# Patient Record
Sex: Female | Born: 1970 | Race: White | Hispanic: No | Marital: Married | Smoking: Never smoker
Health system: Southern US, Community
[De-identification: ages and names within clinical notes are randomized; demographics above are authoritative.]

## PROBLEM LIST (undated history)

## (undated) DIAGNOSIS — Z87442 Personal history of urinary calculi: Secondary | ICD-10-CM

## (undated) DIAGNOSIS — C801 Malignant (primary) neoplasm, unspecified: Secondary | ICD-10-CM

## (undated) DIAGNOSIS — R51 Headache: Secondary | ICD-10-CM

## (undated) DIAGNOSIS — I10 Essential (primary) hypertension: Secondary | ICD-10-CM

## (undated) HISTORY — PX: DILATION AND CURETTAGE OF UTERUS: SHX78

## (undated) HISTORY — PX: APPENDECTOMY: SHX54

---

## 2003-10-06 ENCOUNTER — Emergency Department (HOSPITAL_COMMUNITY): Admission: EM | Admit: 2003-10-06 | Discharge: 2003-10-06 | Payer: Self-pay | Admitting: Family Medicine

## 2003-11-26 ENCOUNTER — Encounter (INDEPENDENT_AMBULATORY_CARE_PROVIDER_SITE_OTHER): Payer: Self-pay | Admitting: *Deleted

## 2003-11-26 ENCOUNTER — Ambulatory Visit (HOSPITAL_COMMUNITY): Admission: AD | Admit: 2003-11-26 | Discharge: 2003-11-26 | Payer: Self-pay | Admitting: Obstetrics and Gynecology

## 2004-11-02 ENCOUNTER — Ambulatory Visit (HOSPITAL_COMMUNITY): Admission: RE | Admit: 2004-11-02 | Discharge: 2004-11-02 | Payer: Self-pay | Admitting: Obstetrics and Gynecology

## 2004-12-06 ENCOUNTER — Inpatient Hospital Stay (HOSPITAL_COMMUNITY): Admission: AD | Admit: 2004-12-06 | Discharge: 2004-12-08 | Payer: Self-pay | Admitting: Obstetrics and Gynecology

## 2006-09-22 ENCOUNTER — Inpatient Hospital Stay (HOSPITAL_COMMUNITY): Admission: RE | Admit: 2006-09-22 | Discharge: 2006-09-24 | Payer: Self-pay | Admitting: Obstetrics and Gynecology

## 2010-07-03 NOTE — H&P (Signed)
Kathryn Dean, WILLISON NO.:  [id number]   MEDICAL RECORD NO.:  [id number]          PATIENT TYPE:  INP   LOCATION:  9127                          FACILITY:  [facility]   PHYSICIAN:  Lenoard Aden, M.D.DATE OF BIRTH:  24-Apr-1970   DATE OF ADMISSION:  09/22/2006  DATE OF DISCHARGE:                              HISTORY & PHYSICAL   INDICATION:  For induction.   HISTORY:  The patient is a 40 year old white female, G4, P3, at [redacted] weeks  gestation for induction due to favorable cervix and history of  precipitous labor.  She has no known drug allergies.   MEDICATIONS:  Prenatal vitamins.   She has a history of an appendectomy, kidney stones, macrosomia, missed  AB x1, SAB x2, and three uncomplicated vaginal deliveries.   She is a nonsmoker, nondrinker. She denies domestic or physical  violence.   FAMILY HISTORY:  Epilepsy, myocardial infarction, lung cancer, rheumatic  joint disease.   PHYSICAL EXAMINATION:  GENERAL:  She is a well-developed, well-nourished  white female in no acute distress.  HEENT:  Normal.  LUNGS: Clear.  HEART:  Regular rate and rhythm.  ABDOMEN: Soft, gravid, nontender.  Estimated fetal weight 8 pounds.  PELVIC:  Cervix is 3, 70%, vertex, -1.  EXTREMITIES:  No cords.  NEUROLOGIC:  Exam nonfocal.   IMPRESSION:  1. A 39-week intrauterine pregnancy.  2. History of precipitous labor with favorable cervix for induction.   PLAN:  Proceed with induction. Risks, benefits discussed.      Lenoard Aden, M.D.  Electronically Signed     RJT/MEDQ  D:  09/22/2006  T:  09/22/2006  Job:  [id number]

## 2010-07-06 NOTE — H&P (Signed)
NAMEMADYSEN, Kathryn Dean NO.:  [id number]   MEDICAL RECORD NO.:  [id number]          PATIENT TYPE:  INP   LOCATION:                                [facility]   PHYSICIAN:  Lenoard Aden, M.D.DATE OF BIRTH:  1970/10/30   DATE OF ADMISSION:  12/06/2004  DATE OF DISCHARGE:  12/08/2004                                HISTORY & PHYSICAL   CHIEF COMPLAINT:  Post dates.   HISTORY OF PRESENT ILLNESS:  She is a 40 year old white female, gravida 6,  para 3-0-3-2 at 40+ weeks with a favorable cervix for post dates induction.   ALLERGIES:  PENICILLIN, CIPRO.   MEDICATIONS:  Prenatal vitamins.   PAST MEDICAL HISTORY:  Two vaginal deliveries of 9 pound 3 ounce female and  8 pound 5 ounce female.  Two SAB's with D&E and one complete SAB.   SOCIAL HISTORY:  She is a nonsmoker and nondrinker.  Denies domestic or  physical violence.  She has a history of urolithiasis and history of  appendectomy.   FAMILY HISTORY:  Kidney and lung cancer, myocardial infarction, and DVT.   PRENATAL LABORATORY DATA:  Blood type O positive, rubella immune, hepatitis  and HIV nonreactive, GC and Chlamydia negative, and GBS is negative.  Pregnancy complicated by post dates and otherwise presumptive urolithiasis  in the third trimester.   PHYSICAL EXAMINATION:  GENERAL:  She is a well-developed, well-nourished,  white female in no acute distress.  HEENT:  Normal.  LUNGS:  Clear.  HEART:  Regular rate and rhythm.  ABDOMEN:  Soft, gravid, and nontender.  Estimated fetal weight 7-1/2 pounds.  PELVIC:  Cervix 2-3 cm, vertex, thick, -2.  EXTREMITIES:  No cords.  NEUROLOGY:  Nonfocal.   IMPRESSION:  1.  40+ week intrauterine pregnancy.  2.  History of kidney stones.   PLAN:  Proceed with induction.  Risks, benefits, discussed.  Pitocin and  epidural as needed.      Lenoard Aden, M.D.  Electronically Signed     RJT/MEDQ  D:  12/05/2004  T:  12/05/2004  Job:  [id number]

## 2010-07-06 NOTE — Op Note (Signed)
NAMEUNDRA, TREMBATH NO.:  [id number]   MEDICAL RECORD NO.:  [id number]          PATIENT TYPE:  AMB   LOCATION:  SDC                           [facility]  [facility]   PHYSICIAN:  Lenoard Aden, M.D.DATE OF BIRTH:  September 07, 1970   DATE OF PROCEDURE:  11/26/2003  DATE OF DISCHARGE:                                 OPERATIVE REPORT   PREOPERATIVE DIAGNOSIS:  Missed abortion at seven weeks.   POSTOPERATIVE DIAGNOSIS:  Missed abortion at seven weeks.   PROCEDURE:  Suction dilation and evacuation.   SURGEON:  Lenoard Aden, M.D.   ANESTHESIA:  MAC and paracervical.   ESTIMATED BLOOD LOSS:  50 mL.   COMPLICATIONS:  None.   DRAINS:  None.   COUNTS:  Correct.   Patient to recovery in good condition.   BRIEF OPERATIVE NOTE:  After being apprised of the risks of anesthesia,  infection, bleeding, injury to abdominal organs with need for repair,  uterine perforation and possible need for repair, the patient was brought to  the operating room, where she was administered IV sedation without  difficulty, prepped and draped in the usual sterile fashion, catheterized  until the bladder empty.  Exam under anesthesia revealed an eight-week size  uterus, no adnexal masses.  A paracervical block then placed with 20 mL of a  dilute Xylocaine solution.  The cervix is grasped with a single-tooth  tenaculum and easily dilated up to a #23 Pratt dilator after sounding to 10  cm.  A 7 mm suction curette placed, suction is initiated, and products of  conception are noted.  Repeat suction curettage reveals the cavity to be  empty, good hemostasis is noted.  All instruments are removed.  The patient  is transferred to recovery in good condition.      RJT/MEDQ  D:  11/26/2003  T:  11/26/2003  Job:  [id number]

## 2010-12-03 LAB — CBC
HCT: 27.6 — ABNORMAL LOW
HCT: 29.4 — ABNORMAL LOW
Hemoglobin: 10.3 — ABNORMAL LOW
Hemoglobin: 9.7 — ABNORMAL LOW
MCHC: 34.9
MCHC: 35
MCV: 86.4
RBC: 3.05 — ABNORMAL LOW
RBC: 3.4 — ABNORMAL LOW
WBC: 10.9 — ABNORMAL HIGH

## 2010-12-03 LAB — CCBB MATERNAL DONOR DRAW

## 2010-12-03 LAB — RPR: RPR Ser Ql: NONREACTIVE

## 2011-02-28 ENCOUNTER — Other Ambulatory Visit (HOSPITAL_COMMUNITY): Payer: Self-pay | Admitting: Obstetrics and Gynecology

## 2011-02-28 DIAGNOSIS — Z1231 Encounter for screening mammogram for malignant neoplasm of breast: Secondary | ICD-10-CM

## 2011-03-29 ENCOUNTER — Ambulatory Visit (HOSPITAL_COMMUNITY)
Admission: RE | Admit: 2011-03-29 | Discharge: 2011-03-29 | Disposition: A | Discharge status: Home / Self Care | Payer: BC Managed Care – PPO | Source: Ambulatory Visit | Attending: Obstetrics and Gynecology | Admitting: Obstetrics and Gynecology

## 2011-03-29 DIAGNOSIS — Z1231 Encounter for screening mammogram for malignant neoplasm of breast: Secondary | ICD-10-CM | POA: Insufficient documentation

## 2011-05-16 ENCOUNTER — Other Ambulatory Visit: Payer: Self-pay | Admitting: Obstetrics and Gynecology

## 2011-05-16 ENCOUNTER — Encounter (HOSPITAL_COMMUNITY)
Admission: RE | Admit: 2011-05-16 | Discharge: 2011-05-16 | Disposition: A | Discharge status: Home / Self Care | Payer: BC Managed Care – PPO | Source: Ambulatory Visit | Attending: Obstetrics and Gynecology | Admitting: Obstetrics and Gynecology

## 2011-05-16 ENCOUNTER — Encounter (HOSPITAL_COMMUNITY): Payer: Self-pay

## 2011-05-16 ENCOUNTER — Other Ambulatory Visit: Payer: Self-pay

## 2011-05-16 DIAGNOSIS — Z01812 Encounter for preprocedural laboratory examination: Secondary | ICD-10-CM | POA: Insufficient documentation

## 2011-05-16 DIAGNOSIS — Z01818 Encounter for other preprocedural examination: Secondary | ICD-10-CM | POA: Insufficient documentation

## 2011-05-16 HISTORY — DX: Essential (primary) hypertension: I10

## 2011-05-16 HISTORY — DX: Headache: R51

## 2011-05-16 LAB — CBC
HCT: 40.6 % (ref 36.0–46.0)
Hemoglobin: 13.2 g/dL (ref 12.0–15.0)
MCH: 29.3 pg (ref 26.0–34.0)
Platelets: 235 10*3/uL (ref 150–400)

## 2011-05-16 NOTE — Patient Instructions (Addendum)
   Your procedure is scheduled on: Wednesday April 3rd  Enter through the [employer] of [facility] at: 9:30am Pick up the phone at the desk and dial [phone] and inform us of your arrival.  Please call this number if you have any problems the morning of surgery: [phone]  Remember: Do not eat food after midnight: Tuesday Do not drink clear liquids after: 7am Wednesday Take these medicines the morning of surgery with a SIP OF WATER: none  Do not wear jewelry, make-up, or FINGER nail polish Do not wear lotions, powders, perfumes or deodorant. Do not shave 48 hours prior to surgery. Do not bring valuables to the hospital. Contacts, dentures or bridgework may not be worn into surgery.    Patients discharged on the day of surgery will not be allowed to drive home.     Remember to use your hibiclens as instructed.Please shower with 1/2 bottle the evening before your surgery and the other 1/2 bottle the morning of surgery. Neck down avoiding private area.

## 2011-05-16 NOTE — Pre-Procedure Instructions (Addendum)
PT states she saw her pcp Dr Rene Paci approx 2 weeks ago for routine workup-her blood pressure was elevated at that time, 150's/115-was told by pcp to watch it over next week-pt states it was 140/90 over weekend-was to see her pcp in 2 weeks.  At pat visit-b/p 152/113-notified Dr Margarite Gouge see pcp for b/p control prior to surgery visit. Notified Shanelle at Dr Jorene Minors office-message left on voicemail. Per Dr Penne Lash cancelling surgery -would like pt to f/u with pcp

## 2011-05-18 ENCOUNTER — Encounter (HOSPITAL_COMMUNITY): Payer: Self-pay | Admitting: Pharmacist

## 2011-06-24 ENCOUNTER — Encounter (HOSPITAL_COMMUNITY): Payer: Self-pay

## 2011-06-24 ENCOUNTER — Encounter (HOSPITAL_COMMUNITY)
Admission: RE | Admit: 2011-06-24 | Discharge: 2011-06-24 | Disposition: A | Discharge status: Home / Self Care | Payer: BC Managed Care – PPO | Source: Ambulatory Visit | Attending: Obstetrics and Gynecology | Admitting: Obstetrics and Gynecology

## 2011-06-24 NOTE — Patient Instructions (Addendum)
20 MARJARIE IRION  06/24/2011   Your procedure is scheduled on:  07/08/11  Enter through the Main Entrance of [facility] at 715 AM.  Pick up the phone at the desk and dial [phone].   Call this number if you have problems the morning of surgery: [phone]   Remember:   Do not eat food:After Midnight.  Do not drink clear liquids: After Midnight.  Take these medicines the morning of surgery with A SIP OF WATER: Lisinopril HCTZ   Do not wear jewelry, make-up or nail polish.  Do not wear lotions, powders, or perfumes. You may wear deodorant.  Do not shave 48 hours prior to surgery.  Do not bring valuables to the hospital.  Contacts, dentures or bridgework may not be worn into surgery.  Leave suitcase in the car. After surgery it may be brought to your room.  For patients admitted to the hospital, checkout time is 11:00 AM the day of discharge.   Patients discharged the day of surgery will not be allowed to drive home.  Name and phone number of your driver: NA  Special Instructions: CHG Shower Use Special Wash: 1/2 bottle night before surgery and 1/2 bottle morning of surgery.   Please read over the following fact sheets that you were given: Surgical Site Infection Prevention

## 2011-07-02 ENCOUNTER — Other Ambulatory Visit (HOSPITAL_COMMUNITY): Payer: BC Managed Care – PPO

## 2011-07-04 ENCOUNTER — Other Ambulatory Visit: Payer: Self-pay | Admitting: Obstetrics and Gynecology

## 2011-07-07 MED ORDER — CEFAZOLIN SODIUM-DEXTROSE 2-3 GM-% IV SOLR
2.0000 g | INTRAVENOUS | Status: AC
  Administered 2011-07-08: 2 g via INTRAVENOUS
  Filled 2011-07-07: qty 50

## 2011-07-07 NOTE — H&P (Signed)
NAMEALYANA, Kathryn Dean NO.:  [id number]  MEDICAL RECORD NO.:  [id number]  LOCATION:  PERIO                         [facility]ILITY:  [facility]  PHYSICIAN:  Lenoard Aden, M.D.DATE OF BIRTH:  Apr 25, 1970  DATE OF ADMISSION:  05/15/2011 DATE OF DISCHARGE:                             HISTORY & PHYSICAL   CHIEF COMPLAINT:  Menorrhagia with a history of hypertension.  She is a 41 year old white female, G7, P4, who presents with menorrhagia for definitive therapy.  Her birth control is husband status post vasectomy.  MEDICATIONS:  Previously, birth control pills.  SOCIAL HISTORY:  She is a nonsmoker, nondrinker.  Denies domestic or physical violence.  FAMILY HISTORY:  Cancer, rheumatoid arthritis, COPD, epilepsy, history of vaginal delivery x4.  Family history otherwise noncontributory.  PAST SURGICAL HISTORY:  Noncontributory.  ALLERGIES:  To BIAXIN, CIPRO, PENICILLIN.  PHYSICAL EXAMINATION:  GENERAL:  Well-developed, well-nourished white female, in no acute distress. VITAL SIGNS:  Weight of 209 pounds, height is 64 inches, 124/84 blood pressure. HEENT:  Normal. NECK:  Supple.  Full range of motion. LUNGS:  Clear. HEART:  Regular rhythm. ABDOMEN:  Soft, nontender. PELVIC:  Normal size uterus.  No adnexal masses. EXTREMITIES:  There are no cords. NEUROLOGIC:  Nonfocal. SKIN:  Intact.  IMPRESSION:  Refractory menorrhagia for definitive therapy.  PLAN:  Diagnostic hysteroscopy, D and C, NovaSure.  Risks of anesthesia, infection, bleeding, injury to abdominal organs, need for repair is discussed.  Delayed versus immediate complications to include bowel and bladder injury noted.  The patient acknowledges.  We will proceed.     Lenoard Aden, M.D.     RJT/MEDQ  D:  07/07/2011  T:  07/07/2011  Job:  [id number]

## 2011-07-08 ENCOUNTER — Ambulatory Visit (HOSPITAL_COMMUNITY)
Admission: RE | Admit: 2011-07-08 | Discharge: 2011-07-08 | Disposition: A | Discharge status: Home / Self Care | Payer: BC Managed Care – PPO | Source: Ambulatory Visit | Attending: Obstetrics and Gynecology | Admitting: Obstetrics and Gynecology

## 2011-07-08 ENCOUNTER — Encounter (HOSPITAL_COMMUNITY): Payer: Self-pay | Admitting: Anesthesiology

## 2011-07-08 ENCOUNTER — Encounter (HOSPITAL_COMMUNITY)
Admission: RE | Disposition: A | Discharge status: Home / Self Care | Payer: Self-pay | Source: Ambulatory Visit | Attending: Obstetrics and Gynecology

## 2011-07-08 ENCOUNTER — Ambulatory Visit (HOSPITAL_COMMUNITY): Payer: BC Managed Care – PPO | Admitting: Anesthesiology

## 2011-07-08 DIAGNOSIS — Z01812 Encounter for preprocedural laboratory examination: Secondary | ICD-10-CM | POA: Insufficient documentation

## 2011-07-08 DIAGNOSIS — Z01818 Encounter for other preprocedural examination: Secondary | ICD-10-CM | POA: Insufficient documentation

## 2011-07-08 DIAGNOSIS — N920 Excessive and frequent menstruation with regular cycle: Secondary | ICD-10-CM

## 2011-07-08 DIAGNOSIS — I10 Essential (primary) hypertension: Secondary | ICD-10-CM | POA: Insufficient documentation

## 2011-07-08 LAB — HCG, SERUM, QUALITATIVE: Preg, Serum: NEGATIVE

## 2011-07-08 LAB — CBC
HCT: 39.6 % (ref 36.0–46.0)
MCH: 29.5 pg (ref 26.0–34.0)
MCHC: 32.6 g/dL (ref 30.0–36.0)
RDW: 14.1 % (ref 11.5–15.5)

## 2011-07-08 SURGERY — DILATATION & CURETTAGE/HYSTEROSCOPY WITH NOVASURE ABLATION
Anesthesia: General | Site: Vagina | Wound class: Clean Contaminated

## 2011-07-08 MED ORDER — OXYCODONE-ACETAMINOPHEN 5-325 MG PO TABS: 1.0000 | ORAL_TABLET | ORAL | Status: AC | PRN

## 2011-07-08 MED ORDER — PROPOFOL 10 MG/ML IV EMUL
INTRAVENOUS | Status: DC | PRN
  Administered 2011-07-08: 150 mg via INTRAVENOUS

## 2011-07-08 MED ORDER — CEFAZOLIN SODIUM 1-5 GM-% IV SOLN
INTRAVENOUS | Status: AC
  Filled 2011-07-08: qty 50

## 2011-07-08 MED ORDER — FENTANYL CITRATE 0.05 MG/ML IJ SOLN
INTRAMUSCULAR | Status: DC | PRN
  Administered 2011-07-08 (×4): 50 ug via INTRAVENOUS

## 2011-07-08 MED ORDER — FENTANYL CITRATE 0.05 MG/ML IJ SOLN
INTRAMUSCULAR | Status: AC
  Filled 2011-07-08: qty 2

## 2011-07-08 MED ORDER — VASOPRESSIN 20 UNIT/ML IJ SOLN
INTRAMUSCULAR | Status: AC
  Filled 2011-07-08: qty 1

## 2011-07-08 MED ORDER — MEPERIDINE HCL 25 MG/ML IJ SOLN
6.2500 mg | INTRAMUSCULAR | Status: DC | PRN
Start: 2011-07-08 — End: 2011-07-08

## 2011-07-08 MED ORDER — KETOROLAC TROMETHAMINE 30 MG/ML IJ SOLN: 15.0000 mg | Freq: Once | INTRAMUSCULAR | Status: DC | PRN

## 2011-07-08 MED ORDER — LIDOCAINE HCL (CARDIAC) 20 MG/ML IV SOLN
INTRAVENOUS | Status: AC
  Filled 2011-07-08: qty 5

## 2011-07-08 MED ORDER — BUPIVACAINE HCL (PF) 0.25 % IJ SOLN
INTRAMUSCULAR | Status: AC
  Filled 2011-07-08: qty 30

## 2011-07-08 MED ORDER — LACTATED RINGERS IV SOLN
INTRAVENOUS | Status: DC
  Administered 2011-07-08: 08:00:00 via INTRAVENOUS

## 2011-07-08 MED ORDER — ONDANSETRON HCL 4 MG/2ML IJ SOLN
INTRAMUSCULAR | Status: DC | PRN
  Administered 2011-07-08: 4 mg via INTRAVENOUS

## 2011-07-08 MED ORDER — PROPOFOL 10 MG/ML IV EMUL
INTRAVENOUS | Status: AC
  Filled 2011-07-08: qty 20

## 2011-07-08 MED ORDER — LACTATED RINGERS IR SOLN
Status: DC | PRN
  Administered 2011-07-08: 3000 mL

## 2011-07-08 MED ORDER — MIDAZOLAM HCL 5 MG/5ML IJ SOLN
INTRAMUSCULAR | Status: DC | PRN
  Administered 2011-07-08: 2 mg via INTRAVENOUS

## 2011-07-08 MED ORDER — FENTANYL CITRATE 0.05 MG/ML IJ SOLN
25.0000 ug | INTRAMUSCULAR | Status: DC | PRN
  Administered 2011-07-08: 50 ug via INTRAVENOUS

## 2011-07-08 MED ORDER — ONDANSETRON HCL 4 MG/2ML IJ SOLN
4.0000 mg | Freq: Once | INTRAMUSCULAR | Status: DC | PRN
Start: 2011-07-08 — End: 2011-07-08

## 2011-07-08 MED ORDER — BUPIVACAINE HCL (PF) 0.25 % IJ SOLN
INTRAMUSCULAR | Status: DC | PRN
  Administered 2011-07-08: 20 mL

## 2011-07-08 MED ORDER — MIDAZOLAM HCL 2 MG/2ML IJ SOLN
INTRAMUSCULAR | Status: AC
  Filled 2011-07-08: qty 2

## 2011-07-08 MED ORDER — LIDOCAINE HCL (CARDIAC) 20 MG/ML IV SOLN
INTRAVENOUS | Status: DC | PRN
  Administered 2011-07-08: 50 mg via INTRAVENOUS

## 2011-07-08 MED ORDER — KETOROLAC TROMETHAMINE 30 MG/ML IJ SOLN
INTRAMUSCULAR | Status: DC | PRN
  Administered 2011-07-08: 30 mg via INTRAVENOUS

## 2011-07-08 MED ORDER — KETOROLAC TROMETHAMINE 30 MG/ML IJ SOLN
INTRAMUSCULAR | Status: AC
  Filled 2011-07-08: qty 1

## 2011-07-08 MED ORDER — ONDANSETRON HCL 4 MG/2ML IJ SOLN
INTRAMUSCULAR | Status: AC
  Filled 2011-07-08: qty 2

## 2011-07-08 SURGICAL SUPPLY — 12 items
ABLATOR ENDOMETRIAL BIPOLAR (ABLATOR) ×2 IMPLANT
CATH ROBINSON RED A/P 16FR (CATHETERS) ×2 IMPLANT
CLOTH BEACON ORANGE TIMEOUT ST (SAFETY) ×2 IMPLANT
CONTAINER PREFILL 10% NBF 60ML (FORM) ×4 IMPLANT
GLOVE BIO SURGEON STRL SZ7.5 (GLOVE) ×4 IMPLANT
GOWN PREVENTION PLUS LG XLONG (DISPOSABLE) ×2 IMPLANT
GOWN STRL REIN XL XLG (GOWN DISPOSABLE) ×2 IMPLANT
PACK HYSTEROSCOPY LF (CUSTOM PROCEDURE TRAY) ×2 IMPLANT
PAD PREP 24X48 CUFFED NSTRL (MISCELLANEOUS) ×2 IMPLANT
SYR TB 1ML 25GX5/8 (SYRINGE) ×2 IMPLANT
TOWEL OR 17X24 6PK STRL BLUE (TOWEL DISPOSABLE) ×4 IMPLANT
WATER STERILE IRR 1000ML POUR (IV SOLUTION) ×2 IMPLANT

## 2011-07-08 NOTE — Op Note (Signed)
07/08/2011  9:24 AM  PATIENT:  Laurey Arrow  41 y.o. female  PRE-OPERATIVE DIAGNOSIS:  Menorrhagia  POST-OPERATIVE DIAGNOSIS:  Menorrhagia  PROCEDURE:  Procedure(s): DILATATION & CURETTAGE/HYSTEROSCOPY WITH NOVASURE ABLATION  SURGEON:  Surgeon(s): Lenoard Aden, MD  ASSISTANTS: none   ANESTHESIA:   local and general  ESTIMATED BLOOD LOSS: Minimal Fluid deficit less than 50cc  DRAINS: none   LOCAL MEDICATIONS USED:  MARCAINE     SPECIMEN:  Source of Specimen:  EMC   DISPOSITION OF SPECIMEN:  PATHOLOGY  COUNTS:  YES  DICTATION #: [id number]  PLAN OF CARE: DC home  PATIENT DISPOSITION:  PACU - hemodynamically stable.

## 2011-07-08 NOTE — Op Note (Signed)
Kathryn Dean, Kathryn Dean NO.:  [id number]  MEDICAL RECORD NO.:  [id number]  LOCATION:  [facility]                          [facility]  PHYSICIAN:  Lenoard Aden, M.D.DATE OF BIRTH:  1970/06/05  DATE OF PROCEDURE:  07/08/2011 DATE OF DISCHARGE:                              OPERATIVE REPORT   DESCRIPTION OF PROCEDURE:  After being apprised of risks of anesthesia, infection, bleeding, injury to abdominal organs, need for repair, delayed versus immediate complications to include bowel and bladder injury, possible need for repair, the patient was brought to the operating room, where she was administered a general anesthetic without complications.  Prepped and draped in usual sterile fashion. Catheterized until the bladder was empty.  Exam under anesthesia revealed an anteflexed uterus and no adnexal masses.  A weighted speculum placed, dilute Marcaine solution placed for a paracervical block, 20 mL total.  The cervix easily dilated up to a #23 Pratt dilator.  Hysteroscope placed.  Visualization reveals a normal endometrial cavity, sounding length of 6.5.  Cervical cavity width of 4 was noted.  D and C was performed in a 4-quadrant method using sharp curettage and EMC was sent for permanent pathology.  After the NovaSure device was placed, CO2 test was performed, was negative.  Sounding length and cavity width are as noted.  The procedure was then initiated after negative CO2 test for a time of 46 seconds for a power of 136 watts.  The device was then removed intact and inspected.  Good hemostasis was noted upon revisualization.  No evidence of uterine perforation.  A well-ablated cavity was noted.  All instruments were removed.  Minimal bleeding was noted.  Fluid deficit was less than 50 mL.  The patient was awakened and transferred to recovery in good condition.     Lenoard Aden, M.D.     RJT/MEDQ  D:  07/08/2011  T:  07/08/2011  Job:  [id number]

## 2011-07-08 NOTE — Progress Notes (Signed)
Patient ID: Kathryn Dean, female   DOB: 05-26-1970, 41 y.o.   MRN: [id number] Patient seen and examined. Consent witnessed and signed. No changes noted. Update completed.

## 2011-07-08 NOTE — Discharge Instructions (Signed)

## 2011-07-08 NOTE — Anesthesia Postprocedure Evaluation (Signed)
Anesthesia Post Note  Patient: Kathryn Dean  Procedure(s) Performed: Procedure(s) (LRB): DILATATION & CURETTAGE/HYSTEROSCOPY WITH NOVASURE ABLATION (N/A)  Anesthesia type: General  Patient location: PACU  Post pain: Pain level controlled  Post assessment: Post-op Vital signs reviewed  Last Vitals:  Filed Vitals:   07/08/11 0945  BP: 123/79  Pulse: 73  Temp:   Resp: 16    Post vital signs: Reviewed  Level of consciousness: sedated  Complications: No apparent anesthesia complicationsfj

## 2011-07-08 NOTE — Transfer of Care (Signed)
Immediate Anesthesia Transfer of Care Note  Patient: Kathryn Dean  Procedure(s) Performed: Procedure(s) (LRB): DILATATION & CURETTAGE/HYSTEROSCOPY WITH NOVASURE ABLATION (N/A)  Patient Location: PACU  Anesthesia Type: General  Level of Consciousness: awake, alert  and oriented  Airway & Oxygen Therapy: Patient Spontanous Breathing and Patient connected to nasal cannula oxygen  Post-op Assessment: Report given to PACU RN and Post -op Vital signs reviewed and stable  Post vital signs: Reviewed and stable  Complications: No apparent anesthesia complications

## 2011-07-08 NOTE — Anesthesia Preprocedure Evaluation (Signed)
Anesthesia Evaluation  Patient identified by MRN, date of birth, ID band Patient awake    Reviewed: Allergy & Precautions, H&P , NPO status , Patient's Chart, lab work & pertinent test results  Airway Mallampati: II TM Distance: >3 FB Neck ROM: full    Dental No notable dental hx. (+) Teeth Intact   Pulmonary neg pulmonary ROS,    Pulmonary exam normal       Cardiovascular     Neuro/Psych negative psych ROS   GI/Hepatic negative GI ROS, Neg liver ROS,   Endo/Other  negative endocrine ROS  Renal/GU negative Renal ROS  negative genitourinary   Musculoskeletal negative musculoskeletal ROS (+)   Abdominal (+) + obese,   Peds negative pediatric ROS (+)  Hematology negative hematology ROS (+)   Anesthesia Other Findings   Reproductive/Obstetrics negative OB ROS                           Anesthesia Physical Anesthesia Plan  ASA: II  Anesthesia Plan: General   Post-op Pain Management:    Induction: Intravenous  Airway Management Planned: LMA  Additional Equipment:   Intra-op Plan:   Post-operative Plan:   Informed Consent: I have reviewed the patients History and Physical, chart, labs and discussed the procedure including the risks, benefits and alternatives for the proposed anesthesia with the patient or authorized representative who has indicated his/her understanding and acceptance.     Plan Discussed with: CRNA and Surgeon  Anesthesia Plan Comments:         Anesthesia Quick Evaluation

## 2012-06-01 ENCOUNTER — Other Ambulatory Visit (HOSPITAL_COMMUNITY): Payer: Self-pay | Admitting: Obstetrics and Gynecology

## 2012-06-01 DIAGNOSIS — Z1231 Encounter for screening mammogram for malignant neoplasm of breast: Secondary | ICD-10-CM

## 2012-06-29 ENCOUNTER — Ambulatory Visit (HOSPITAL_COMMUNITY)
Admission: RE | Admit: 2012-06-29 | Discharge: 2012-06-29 | Disposition: A | Discharge status: Home / Self Care | Payer: BC Managed Care – PPO | Source: Ambulatory Visit | Attending: Obstetrics and Gynecology | Admitting: Obstetrics and Gynecology

## 2012-06-29 DIAGNOSIS — Z1231 Encounter for screening mammogram for malignant neoplasm of breast: Secondary | ICD-10-CM | POA: Insufficient documentation

## 2013-11-03 ENCOUNTER — Other Ambulatory Visit (HOSPITAL_COMMUNITY): Payer: Self-pay | Admitting: Obstetrics and Gynecology

## 2013-11-03 DIAGNOSIS — Z1231 Encounter for screening mammogram for malignant neoplasm of breast: Secondary | ICD-10-CM

## 2013-11-09 ENCOUNTER — Other Ambulatory Visit (HOSPITAL_COMMUNITY): Payer: Self-pay | Admitting: Obstetrics and Gynecology

## 2013-11-09 ENCOUNTER — Ambulatory Visit (HOSPITAL_COMMUNITY)
Admission: RE | Admit: 2013-11-09 | Discharge: 2013-11-09 | Disposition: A | Discharge status: Home / Self Care | Payer: BC Managed Care – PPO | Source: Ambulatory Visit | Attending: Obstetrics and Gynecology | Admitting: Obstetrics and Gynecology

## 2013-11-09 DIAGNOSIS — Z1231 Encounter for screening mammogram for malignant neoplasm of breast: Secondary | ICD-10-CM | POA: Diagnosis present

## 2014-04-29 ENCOUNTER — Ambulatory Visit (INDEPENDENT_AMBULATORY_CARE_PROVIDER_SITE_OTHER): Payer: BLUE CROSS/BLUE SHIELD | Admitting: Emergency Medicine

## 2014-04-29 VITALS — BP 218/124

## 2014-04-29 DIAGNOSIS — I10 Essential (primary) hypertension: Secondary | ICD-10-CM

## 2014-04-29 DIAGNOSIS — E669 Obesity, unspecified: Secondary | ICD-10-CM | POA: Diagnosis not present

## 2014-04-29 LAB — COMPREHENSIVE METABOLIC PANEL
ALBUMIN: 4.3 g/dL (ref 3.5–5.2)
ALK PHOS: 113 U/L (ref 39–117)
ALT: 36 U/L — AB (ref 0–35)
AST: 27 U/L (ref 0–37)
BUN: 13 mg/dL (ref 6–23)
CALCIUM: 9.6 mg/dL (ref 8.4–10.5)
CHLORIDE: 103 meq/L (ref 96–112)
CO2: 22 mEq/L (ref 19–32)
Creat: 0.86 mg/dL (ref 0.50–1.10)
Glucose, Bld: 87 mg/dL (ref 70–99)
POTASSIUM: 4.1 meq/L (ref 3.5–5.3)
SODIUM: 138 meq/L (ref 135–145)
Total Bilirubin: 0.3 mg/dL (ref 0.2–1.2)
Total Protein: 7.4 g/dL (ref 6.0–8.3)

## 2014-04-29 LAB — CBC WITH DIFFERENTIAL/PLATELET
Basophils Absolute: 0 10*3/uL (ref 0.0–0.1)
Basophils Relative: 0 % (ref 0–1)
EOS PCT: 5 % (ref 0–5)
Eosinophils Absolute: 0.3 10*3/uL (ref 0.0–0.7)
HEMATOCRIT: 41.2 % (ref 36.0–46.0)
Hemoglobin: 13.8 g/dL (ref 12.0–15.0)
LYMPHS ABS: 1.4 10*3/uL (ref 0.7–4.0)
LYMPHS PCT: 27 % (ref 12–46)
MCH: 28.9 pg (ref 26.0–34.0)
MCHC: 33.5 g/dL (ref 30.0–36.0)
MCV: 86.2 fL (ref 78.0–100.0)
MONOS PCT: 7 % (ref 3–12)
MPV: 10.2 fL (ref 8.6–12.4)
Monocytes Absolute: 0.4 10*3/uL (ref 0.1–1.0)
NEUTROS PCT: 61 % (ref 43–77)
Neutro Abs: 3.2 10*3/uL (ref 1.7–7.7)
Platelets: 204 10*3/uL (ref 150–400)
RBC: 4.78 MIL/uL (ref 3.87–5.11)
RDW: 13.1 % (ref 11.5–15.5)
WBC: 5.2 10*3/uL (ref 4.0–10.5)

## 2014-04-29 LAB — POCT URINALYSIS DIPSTICK
GLUCOSE UA: NEGATIVE
Leukocytes, UA: NEGATIVE
NITRITE UA: NEGATIVE
PH UA: 6
Protein, UA: 100
Urobilinogen, UA: 0.2

## 2014-04-29 LAB — LIPID PANEL
CHOLESTEROL: 132 mg/dL (ref 0–200)
HDL: 25 mg/dL — AB (ref >=46)
LDL Cholesterol: 87 mg/dL (ref 0–99)
TRIGLYCERIDES: 99 mg/dL (ref <150)
Total CHOL/HDL Ratio: 5.3 Ratio
VLDL: 20 mg/dL (ref 0–40)

## 2014-04-29 LAB — POCT UA - MICROSCOPIC ONLY
Bacteria, U Microscopic: NEGATIVE
CASTS, UR, LPF, POC: NEGATIVE
Crystals, Ur, HPF, POC: NEGATIVE
MUCUS UA: NEGATIVE
YEAST UA: NEGATIVE

## 2014-04-29 LAB — TSH: TSH: 2.187 u[IU]/mL (ref 0.350–4.500)

## 2014-04-29 MED ORDER — LISINOPRIL-HYDROCHLOROTHIAZIDE 20-12.5 MG PO TABS: 1.0000 | ORAL_TABLET | Freq: Every day | ORAL | Status: AC

## 2014-04-29 MED ORDER — LISINOPRIL-HYDROCHLOROTHIAZIDE 20-12.5 MG PO TABS: 1.0000 | ORAL_TABLET | Freq: Every day | ORAL | Status: DC

## 2014-04-29 NOTE — Progress Notes (Signed)
[facility] [address], [place] [address] [phone]  Date:  04/29/2014   Name:  Kathryn Dean   DOB:  [birth year]   MRN:  [id number]  PCP:  Wenda Low, MD    Chief Complaint: No chief complaint on file.   History of Present Illness:  Kathryn Dean is a 44 y.o. very pleasant female patient who presents with the following: Patient was in a room and developed a diaphoresis and has had a headache for past week. Attributed it to a "sinus problem".   No neuro or visual symptoms. No chest pain, heaviness or pressure. No shortness of breath. No nausea or vomiting.  No stool change. Has nasal congestion and pressure in cheeks.  Mucoid drainage. Non productive cough. No wheezing or shortness of breath No fever or chills. Symptoms improving. No improvement with over the counter medications or other home remedies.  Denies other complaint or health concern today.   There are no active problems to display for this patient.   Past Medical History  Diagnosis Date  . Hypertension     evaluated at pcp-not on any meds at this point  . Headache     Past Surgical History  Procedure Laterality Date  . Appendectomy    . Vaginal delivery    . Dilation and curettage of uterus      x2    History  Substance Use Topics  . Smoking status: Never Smoker   . Smokeless tobacco: Not on file  . Alcohol Use: Yes     Comment: social    No family history on file.  Allergies  Allergen Reactions  . Biaxin [Clarithromycin] Other (See Comments)    blisters  . Penicillins Rash    Medication list has been reviewed and updated.  Current Outpatient Prescriptions on File Prior to Visit  Medication Sig Dispense Refill  . acetaminophen (TYLENOL) 500 MG tablet Take 1,000 mg by mouth daily as needed. headache    . lisinopril-hydrochlorothiazide (PRINZIDE,ZESTORETIC) 20-12.5 MG per tablet Take 1 tablet by mouth daily.     No current facility-administered medications  on file prior to visit.    Review of Systems:   As per HPI, otherwise negative.    Physical Examination: Filed Vitals:   04/29/14 1034  BP: 218/124   There were no vitals filed for this visit. There is no weight on file to calculate BMI. Ideal Body Weight:    GEN: WDWN, NAD, Non-toxic, A & O x 3 HEENT: Atraumatic, Normocephalic. Neck supple. No masses, No LAD. Ears and Nose: No external deformity. CV: RRR, No M/G/R. No JVD. No thrill. No extra heart sounds. PULM: CTA B, no wheezes, crackles, rhonchi. No retractions. No resp. distress. No accessory muscle use. ABD: S, NT, ND, +BS. No rebound. No HSM. EXTR: No c/c/e NEURO Normal gait.  PSYCH: Normally interactive. Conversant. Not depressed or anxious appearing.  Calm demeanor.    Assessment and Plan: Sudden hypertension Labs pending EKG unremarkable Lisinopril HCTZ Follow up in 1 week  Signed,  Ellison Carwin, MD

## 2014-04-29 NOTE — Patient Instructions (Signed)

## 2014-05-18 ENCOUNTER — Encounter: Payer: Self-pay | Admitting: Family Medicine

## 2014-05-18 NOTE — Progress Notes (Signed)
lmom to give our Appt center a call back to schedule an appt with Dr Lorelei Pont f/u on BP i will sent out a letter

## 2015-08-10 ENCOUNTER — Other Ambulatory Visit: Payer: Self-pay | Admitting: Internal Medicine

## 2015-08-10 ENCOUNTER — Ambulatory Visit
Admission: RE | Admit: 2015-08-10 | Discharge: 2015-08-10 | Disposition: A | Discharge status: Home / Self Care | Payer: BLUE CROSS/BLUE SHIELD | Source: Ambulatory Visit | Attending: Internal Medicine | Admitting: Internal Medicine

## 2015-08-10 DIAGNOSIS — R0602 Shortness of breath: Secondary | ICD-10-CM

## 2015-08-10 DIAGNOSIS — R05 Cough: Secondary | ICD-10-CM

## 2015-08-10 DIAGNOSIS — R059 Cough, unspecified: Secondary | ICD-10-CM

## 2015-11-13 DIAGNOSIS — I10 Essential (primary) hypertension: Secondary | ICD-10-CM | POA: Diagnosis not present

## 2016-01-29 ENCOUNTER — Other Ambulatory Visit: Payer: Self-pay | Admitting: Obstetrics and Gynecology

## 2016-01-29 DIAGNOSIS — Z1231 Encounter for screening mammogram for malignant neoplasm of breast: Secondary | ICD-10-CM

## 2016-02-26 ENCOUNTER — Ambulatory Visit
Admission: RE | Admit: 2016-02-26 | Discharge: 2016-02-26 | Disposition: A | Discharge status: Home / Self Care | Payer: BLUE CROSS/BLUE SHIELD | Source: Ambulatory Visit | Attending: Obstetrics and Gynecology | Admitting: Obstetrics and Gynecology

## 2016-02-26 DIAGNOSIS — Z1231 Encounter for screening mammogram for malignant neoplasm of breast: Secondary | ICD-10-CM | POA: Diagnosis not present

## 2016-05-31 DIAGNOSIS — Z0000 Encounter for general adult medical examination without abnormal findings: Secondary | ICD-10-CM | POA: Diagnosis not present

## 2016-05-31 DIAGNOSIS — Z23 Encounter for immunization: Secondary | ICD-10-CM | POA: Diagnosis not present

## 2016-05-31 DIAGNOSIS — Z1389 Encounter for screening for other disorder: Secondary | ICD-10-CM | POA: Diagnosis not present

## 2016-08-13 DIAGNOSIS — L400 Psoriasis vulgaris: Secondary | ICD-10-CM | POA: Diagnosis not present

## 2016-08-13 DIAGNOSIS — L570 Actinic keratosis: Secondary | ICD-10-CM | POA: Diagnosis not present

## 2016-08-13 DIAGNOSIS — D225 Melanocytic nevi of trunk: Secondary | ICD-10-CM | POA: Diagnosis not present

## 2016-08-13 DIAGNOSIS — D1801 Hemangioma of skin and subcutaneous tissue: Secondary | ICD-10-CM | POA: Diagnosis not present

## 2016-09-02 DIAGNOSIS — L409 Psoriasis, unspecified: Secondary | ICD-10-CM | POA: Diagnosis not present

## 2016-09-17 DIAGNOSIS — K625 Hemorrhage of anus and rectum: Secondary | ICD-10-CM | POA: Diagnosis not present

## 2016-10-07 DIAGNOSIS — K625 Hemorrhage of anus and rectum: Secondary | ICD-10-CM | POA: Diagnosis not present

## 2016-10-07 DIAGNOSIS — K219 Gastro-esophageal reflux disease without esophagitis: Secondary | ICD-10-CM | POA: Diagnosis not present

## 2016-10-11 DIAGNOSIS — D126 Benign neoplasm of colon, unspecified: Secondary | ICD-10-CM | POA: Diagnosis not present

## 2016-10-11 DIAGNOSIS — K5730 Diverticulosis of large intestine without perforation or abscess without bleeding: Secondary | ICD-10-CM | POA: Diagnosis not present

## 2016-10-11 DIAGNOSIS — K635 Polyp of colon: Secondary | ICD-10-CM | POA: Diagnosis not present

## 2016-10-11 DIAGNOSIS — K629 Disease of anus and rectum, unspecified: Secondary | ICD-10-CM | POA: Diagnosis not present

## 2016-10-11 DIAGNOSIS — C19 Malignant neoplasm of rectosigmoid junction: Secondary | ICD-10-CM | POA: Diagnosis not present

## 2016-10-15 ENCOUNTER — Other Ambulatory Visit: Payer: Self-pay | Admitting: Gastroenterology

## 2016-10-15 DIAGNOSIS — K635 Polyp of colon: Secondary | ICD-10-CM | POA: Diagnosis not present

## 2016-10-15 DIAGNOSIS — D126 Benign neoplasm of colon, unspecified: Secondary | ICD-10-CM | POA: Diagnosis not present

## 2016-10-15 DIAGNOSIS — C19 Malignant neoplasm of rectosigmoid junction: Secondary | ICD-10-CM | POA: Diagnosis not present

## 2016-10-15 DIAGNOSIS — C801 Malignant (primary) neoplasm, unspecified: Secondary | ICD-10-CM

## 2016-10-18 ENCOUNTER — Other Ambulatory Visit: Payer: Self-pay | Admitting: Gastroenterology

## 2016-10-18 ENCOUNTER — Ambulatory Visit
Admission: RE | Admit: 2016-10-18 | Discharge: 2016-10-18 | Disposition: A | Discharge status: Home / Self Care | Payer: BLUE CROSS/BLUE SHIELD | Source: Ambulatory Visit | Attending: Gastroenterology | Admitting: Gastroenterology

## 2016-10-18 ENCOUNTER — Encounter: Payer: Self-pay | Admitting: Oncology

## 2016-10-18 ENCOUNTER — Telehealth: Payer: Self-pay | Admitting: Oncology

## 2016-10-18 DIAGNOSIS — N200 Calculus of kidney: Secondary | ICD-10-CM | POA: Diagnosis not present

## 2016-10-18 DIAGNOSIS — C801 Malignant (primary) neoplasm, unspecified: Secondary | ICD-10-CM

## 2016-10-18 DIAGNOSIS — K769 Liver disease, unspecified: Secondary | ICD-10-CM

## 2016-10-18 DIAGNOSIS — R918 Other nonspecific abnormal finding of lung field: Secondary | ICD-10-CM | POA: Diagnosis not present

## 2016-10-18 MED ORDER — IOPAMIDOL (ISOVUE-300) INJECTION 61%
125.0000 mL | Freq: Once | INTRAVENOUS | Status: AC | PRN
  Administered 2016-10-18: 125 mL via INTRAVENOUS

## 2016-10-18 NOTE — Telephone Encounter (Signed)
Appt has been scheduled for the Kathryn Dean to see Dr. Benay Spice on 9/11 at 2pm. Kathryn Dean aware to arrive 30 minutes early. Address verified. Letter mailed.

## 2016-10-23 ENCOUNTER — Ambulatory Visit: Payer: Self-pay | Admitting: Surgery

## 2016-10-23 DIAGNOSIS — C19 Malignant neoplasm of rectosigmoid junction: Secondary | ICD-10-CM | POA: Diagnosis not present

## 2016-10-23 DIAGNOSIS — Z01818 Encounter for other preprocedural examination: Secondary | ICD-10-CM | POA: Diagnosis not present

## 2016-10-28 ENCOUNTER — Telehealth: Payer: Self-pay

## 2016-10-28 NOTE — Telephone Encounter (Signed)
Called pt to confirm that she can wait until after her MRI on 9/12 to come and see MD Sherrill. Pt states that she would rather come in tomorrow after all for her new pt consultation. This RN confirmed her appointment tomorrow and informed her that it would be okay to come in before her MRI. Pt verbalizes understanding and is appreciative of call back.

## 2016-10-29 ENCOUNTER — Telehealth: Payer: Self-pay | Admitting: Oncology

## 2016-10-29 ENCOUNTER — Ambulatory Visit (HOSPITAL_BASED_OUTPATIENT_CLINIC_OR_DEPARTMENT_OTHER): Payer: BLUE CROSS/BLUE SHIELD | Admitting: Oncology

## 2016-10-29 VITALS — BP 136/87 | HR 88 | Temp 98.8°F | Resp 19 | Ht 66.0 in | Wt 217.9 lb

## 2016-10-29 DIAGNOSIS — K769 Liver disease, unspecified: Secondary | ICD-10-CM | POA: Diagnosis not present

## 2016-10-29 DIAGNOSIS — L409 Psoriasis, unspecified: Secondary | ICD-10-CM | POA: Diagnosis not present

## 2016-10-29 DIAGNOSIS — R911 Solitary pulmonary nodule: Secondary | ICD-10-CM | POA: Diagnosis not present

## 2016-10-29 DIAGNOSIS — C19 Malignant neoplasm of rectosigmoid junction: Secondary | ICD-10-CM

## 2016-10-29 NOTE — Telephone Encounter (Signed)
Gave patient avs report and appointments for October  °

## 2016-10-29 NOTE — Progress Notes (Signed)
[name] Patient Consult   Referring MD: Linden Mikes 46 y.o.  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 She was referred to Dr. Alessandra Bevels and was taken to a colonoscopy procedure 10/11/2016. A polyp was removed from the cecum. Polyps were also removed from the hepatic flexure and sigmoid colon. A nonobstructing mass was found at the rectosigmoid at the 20 cm Mark. Biopsies were obtained and the area was tattooed. The pathology revealed a sessile ulcerated polyp at the cecum and hepatic flexure. The sigmoid polyp return to that she will adenoma. The biopsy from the rectosigmoid mass revealed invasive moderately differentiated adenocarcinoma.  CTs of the chest, abdomen, and pelvis on 10/18/2016 revealed a sub-solid 5 mm left lower lobe nodule. A hypervascular lesion was noted at the dome of the right liver measuring 15 mm. The lesion was felt to most likely present a hemangioma. No other focal hepatic lesions. Asymmetric soft tissue nodularity was noted at the superior wall of the rectosigmoid junction. No enlarged abdominal or pelvic lymph nodes.  She was referred to Dr. Johney Maine. She has been scheduled for an MRI of the abdomen 10/30/2016.  Past Medical History:  Diagnosis Date  . Headache   . Hypertension    evaluated at pcp-not on any meds at this point    .  Psoriasis   .  G7, P3, 3 miscarriages  Past Surgical History:  Procedure Laterality Date  . APPENDECTOMY    . DILATION AND CURETTAGE OF UTERUS     x2  . VAGINAL DELIVERY      .  Uterine ablation                           2013  Medications: Reviewed  Allergies:  Allergies  Allergen Reactions  . Biaxin [Clarithromycin] Other (See Comments)    blisters  . Penicillins Rash    Family history:Her mother died of old for carcinoma at age 31, her maternal grandfather had renal cell  carcinoma  Social History:   She lives with her husband and children in [place]. She works as a Museum/gallery exhibitions officer. She does not use cigarettes. Occasional alcohol use. No transfusion history. No risk factor for HIV or hepatitis.  ROS:   Positives include: Rectal bleeding, occasional headaches, diffuse psoriasis lesions  A complete ROS was otherwise negative.  Physical Exam:  Blood pressure 136/87, pulse 88, temperature 98.8 F (37.1 C), temperature source Oral, resp. rate 19, height 5\' 6"  (1.676 m), weight 217 lb 14.4 oz (98.8 kg), SpO2 100 %.  HEENT: Oropharynx without visible mass, neck without mass Lungs: Clear bilaterally Cardiac: Regular rate and rhythm Abdomen: No hepatosplenomegaly, no mass, nontender  Vascular: No leg edema Lymph nodes: No cervical, supraclavicular, axillary, or inguinal nodes Neurologic: Alert and oriented, the motor exam appears intact in the upper and lower extremities Skin: Multiple scaling erythematous plaque lesions over the trunk Musculoskeletal: No spine tenderness   LAB:  CBC 10/11/2016-hemoglobin 12.3, MCV 36.8, platelets 197,000, white count 7.8, ANC 5.8   CEA-1.0   Creatinine 0.89   Imaging:  CT images 10/18/2016-reviewed   Assessment/Plan:   1. Adenocarcinoma of the rectosigmoid colon  Colonoscopy 10/11/2016-mass at 20 cm, biopsy confirmed invasive adenocarcinoma  CTs chest, abdomen, and pelvis 10/18/2016-indeterminate right liver lesion-probable hemangioma, focal thickening at the rectosigmoid junction, 5 mm sub-solid left  lower lobe nodule  2. Multiple colon polyps on the colonoscopy 10/11/2016-sessile ulcerated polyp and tubular adenoma  3.   Psoriasis   Disposition:   Kathryn Dean has been diagnosed with adenocarcinoma of the rectosigmoid colon. I discussed the staging evaluation and treatment options with her. The liver lesion most likely represents a hemangioma. She is scheduled for an abdominal MRI tomorrow.  I  agree with the plan to proceed with surgery if the liver MRI is negative for malignancy. We discussed the rationale for neoadjuvant chemotherapy/radiation in cancers of the rectum. The tumor at 20 centimeters is likely in the distal sigmoid colon or at the rectosigmoid junction.   She will be screened for hereditary non-polyposis colon cancer syndrome on the resection specimen.  I will see Kathryn Dean following surgery to review the surgical pathology and discuss adjuvant treatment options.  50 minutes were spent with the patient today. The majority of the time was used for counseling and coordination of care.  [name], MD  10/29/2016, 3:32 PM

## 2016-10-29 NOTE — Progress Notes (Signed)
Met individually with patient and husband and remained in room with Dr. Benay Spice during visit. I introduced myself and my role as GI navigator. I provided patient with contact information services at [facility]. Patient agreed to call and let me know when her surgery is scheduled with Dr. Johney Maine. Patient and husband agreed to call with any questions.

## 2016-10-30 ENCOUNTER — Ambulatory Visit
Admission: RE | Admit: 2016-10-30 | Discharge: 2016-10-30 | Disposition: A | Discharge status: Home / Self Care | Payer: BLUE CROSS/BLUE SHIELD | Source: Ambulatory Visit | Attending: Gastroenterology | Admitting: Gastroenterology

## 2016-10-30 DIAGNOSIS — K769 Liver disease, unspecified: Secondary | ICD-10-CM

## 2016-10-30 DIAGNOSIS — K7689 Other specified diseases of liver: Secondary | ICD-10-CM | POA: Diagnosis not present

## 2016-10-30 MED ORDER — GADOBENATE DIMEGLUMINE 529 MG/ML IV SOLN
20.0000 mL | Freq: Once | INTRAVENOUS | Status: AC | PRN
  Administered 2016-10-30: 20 mL via INTRAVENOUS

## 2016-11-04 ENCOUNTER — Ambulatory Visit: Payer: Self-pay | Admitting: Surgery

## 2016-11-04 NOTE — H&P (Signed)
Kathryn Dean 10/23/2016 2:30 PM Location: [facility] Patient #: [id number] DOB: Sep 22, 1970 Married / Language: Cleophus Molt / Race: White Female   History of Present Illness Kathryn Hector MD; 10/23/2016 3:20 PM) The patient is a 46 year old female who presents with colorectal cancer. Note for "Colorectal cancer": ` ` ` Patient sent for surgical consultation at the request of Dr. Alessandra Dean  Chief Complaint: Cancer of rectosigmoid junction  The patient is a 46 year old female. She was having rectal bleeding. She underwent colonoscopy. Tubular adenoma removed more proximally. However partially circumferential lesion noted at 20 cm from the anus. Consistent with rectosigmoid junction. Suspicion raised. Biopsied and tattooed. Biopsy consistent with adenocarcinoma. Surgical consultation requested.  Patient had CT scan of chest/abdomen/pelvis. Indeterminate liver lesion on right hepatic lobe. MRI recommended. That is scheduled for next week. Medical oncology appointment requested as well  BM daily. No personal nor family history of GI/colon cancer, inflammatory bowel disease, irritable bowel syndrome, allergy such as Celiac Sprue, dietary/dairy problems, colitis, ulcers nor gastritis. No recent sick contacts/gastroenteritis. No travel outside the country. No changes in diet. No dysphagia to solids or liquids. No significant heartburn or reflux. No hematochezia, hematemesis, coffee ground emesis. No evidence of prior gastric/peptic ulceration. She does have a history of psoriasis. She is supposed to be starting methotrexate, but but held off given the recent diagnosis of cancer.  (Review of systems as stated in this history (HPI) or in the review of systems. Otherwise all other 12 point ROS are negative)   Past Surgical History (Kathryn Dean, [place]; 10/23/2016 2:31 PM) Appendectomy   Diagnostic Studies History (Kathryn Dean, [place]; 10/23/2016 2:31 PM) Mammogram   1-3 years ago  Allergies (Kathryn Dean, [place]; 10/23/2016 2:32 PM) Penicillins  Allergies Reconciled   Medication History (Kathryn Dean, [place]; 10/23/2016 2:32 PM) Lisinopril (20MG  Tablet, Oral) Active. Medications Reconciled  Social History (Kathryn Dean, [place]; 10/23/2016 2:31 PM) Tobacco use  Never smoker.  Pregnancy / Birth History (Kathryn Dean, [place]; 10/23/2016 2:31 PM) Age at menarche  51 years. Gravida  7 Length (months) of breastfeeding  7-12 Maternal age  30-30 Para  4  Other Problems (Kathryn Dean, [place]; 10/23/2016 2:31 PM) Gastroesophageal Reflux Disease     Review of Systems (Kathryn Dean; 10/23/2016 2:31 PM) General Not Present- Appetite Loss, Chills, Fatigue, Fever, Night Sweats, Weight Gain and Weight Loss. Skin Present- Rash. Not Present- Change in Wart/Mole, Dryness, Hives, Jaundice, New Lesions, Non-Healing Wounds and Ulcer. HEENT Not Present- Earache, Hearing Loss, Hoarseness, Nose Bleed, Oral Ulcers, Ringing in the Ears, Seasonal Allergies, Sinus Pain, Sore Throat, Visual Disturbances, Wears glasses/contact lenses and Yellow Eyes. Respiratory Not Present- Bloody sputum, Chronic Cough, Difficulty Breathing, Snoring and Wheezing. Breast Not Present- Breast Mass, Breast Pain, Nipple Discharge and Skin Changes. Cardiovascular Not Present- Chest Pain, Difficulty Breathing Lying Down, Leg Cramps, Palpitations, Rapid Heart Rate, Shortness of Breath and Swelling of Extremities. Gastrointestinal Present- Bloody Stool. Not Present- Abdominal Pain, Bloating, Change in Bowel Habits, Chronic diarrhea, Constipation, Difficulty Swallowing, Excessive gas, Gets full quickly at meals, Hemorrhoids, Indigestion, Nausea, Rectal Pain and Vomiting. Female Genitourinary Not Present- Frequency, Nocturia, Painful Urination, Pelvic Pain and Urgency. Neurological Not Present- Decreased Memory, Fainting, Headaches, Numbness, Seizures, Tingling, Tremor, Trouble walking and  Weakness. Psychiatric Not Present- Anxiety, Bipolar, Change in Sleep Pattern, Depression, Fearful and Frequent crying. Endocrine Not Present- Cold Intolerance, Excessive Hunger, Hair Changes, Heat Intolerance, Hot flashes and New Diabetes. Hematology Not Present- Blood  Thinners, Easy Bruising, Excessive bleeding, Gland problems, HIV and Persistent Infections.  Vitals (Kathryn Dean; 10/23/2016 2:32 PM) 10/23/2016 2:31 PM Weight: 217.8 lb Height: 65in Body Surface Area: 2.05 m Body Mass Index: 36.24 kg/m  Temp.: 97.71F  Pulse: 103 (Regular)  P.OX: 99% (Room air) BP: 138/82 (Sitting, Left Arm, Standard)       Physical Exam Kathryn Hector MD; 10/23/2016 2:54 PM) General Mental Status-Alert. General Appearance-Not in acute distress, Not Sickly. Orientation-Oriented X3. Hydration-Well hydrated. Voice-Normal.  Integumentary Global Assessment Upon inspection and palpation of skin surfaces of the - Axillae: non-tender, no inflammation or ulceration, no drainage. and Distribution of scalp and body hair is normal. General Characteristics Temperature - normal warmth is noted. Note: Intermittently hearing patches on abdomen consistent with psoriasis. Redness underneath pannicular fold   Head and Neck Head-normocephalic, atraumatic with no lesions or palpable masses. Face Global Assessment - atraumatic, no absence of expression. Neck Global Assessment - no abnormal movements, no bruit auscultated on the right, no bruit auscultated on the left, no decreased range of motion, non-tender. Trachea-midline. Thyroid Gland Characteristics - non-tender.  Eye Eyeball - Left-Extraocular movements intact, No Nystagmus. Eyeball - Right-Extraocular movements intact, No Nystagmus. Cornea - Left-No Hazy. Cornea - Right-No Hazy. Sclera/Conjunctiva - Left-No scleral icterus, No Discharge. Sclera/Conjunctiva - Right-No scleral icterus, No Discharge. Pupil  - Left-Direct reaction to light normal. Pupil - Right-Direct reaction to light normal.  ENMT Ears Pinna - Left - no drainage observed, no generalized tenderness observed. Right - no drainage observed, no generalized tenderness observed. Nose and Sinuses External Inspection of the Nose - no destructive lesion observed. Inspection of the nares - Left - quiet respiration. Right - quiet respiration. Mouth and Throat Lips - Upper Lip - no fissures observed, no pallor noted. Lower Lip - no fissures observed, no pallor noted. Nasopharynx - no discharge present. Oral Cavity/Oropharynx - Tongue - no dryness observed. Oral Mucosa - no cyanosis observed. Hypopharynx - no evidence of airway distress observed.  Chest and Lung Exam Inspection Movements - Normal and Symmetrical. Accessory muscles - No use of accessory muscles in breathing. Palpation Palpation of the chest reveals - Non-tender. Auscultation Breath sounds - Normal and Clear.  Cardiovascular Auscultation Rhythm - Regular. Murmurs & Other Heart Sounds - Auscultation of the heart reveals - No Murmurs and No Systolic Clicks.  Abdomen Inspection Inspection of the abdomen reveals - No Visible peristalsis and No Abnormal pulsations. Umbilicus - No Bleeding, No Urine drainage. Palpation/Percussion Palpation and Percussion of the abdomen reveal - Soft, Non Tender, No Rebound tenderness, No Rigidity (guarding) and No Cutaneous hyperesthesia. Note: Abdomen soft. Not severely distended. No distasis recti. No umbilical or other anterior abdominal wall hernias   Female Genitourinary Sexual Maturity Tanner 5 - Adult hair pattern. Note: No vaginal bleeding nor discharge   Rectal Note: Perianal skin clean with good hygiene. No pruritis ani. No pilonidal disease. No fissure. No abscess/fistula. Normal sphincter tone. No external hemorrhoids. No condyloma warts.  Tolerates digital and anoscopic rectal exam. No rectal masses felt to  9cm. Hemorrhoidal piles normal. Exam done with assistance of female Medical Assistant in the room.   Peripheral Vascular Upper Extremity Inspection - Left - No Cyanotic nailbeds, Not Ischemic. Right - No Cyanotic nailbeds, Not Ischemic.  Neurologic Neurologic evaluation reveals -normal attention span and ability to concentrate, able to name objects and repeat phrases. Appropriate fund of knowledge , normal sensation and normal coordination. Mental Status Affect - not angry, not paranoid. Cranial  Nerves-Normal Bilaterally. Gait-Normal.  Neuropsychiatric Mental status exam performed with findings of-able to articulate well with normal speech/language, rate, volume and coherence, thought content normal with ability to perform basic computations and apply abstract reasoning and no evidence of hallucinations, delusions, obsessions or homicidal/suicidal ideation.  Musculoskeletal Global Assessment Spine, Ribs and Pelvis - no instability, subluxation or laxity. Right Upper Extremity - no instability, subluxation or laxity.  Lymphatic Head & Neck  General Head & Neck Lymphatics: Bilateral - Description - No Localized lymphadenopathy. Axillary  General Axillary Region: Bilateral - Description - No Localized lymphadenopathy. Femoral & Inguinal  Generalized Femoral & Inguinal Lymphatics: Left - Description - No Localized lymphadenopathy. Right - Description - No Localized lymphadenopathy.    Assessment & Plan Kathryn Hector MD; 10/23/2016 3:21 PM) PRIMARY CANCER OF RECTOSIGMOID JUNCTION (C19) Impression: Cancer of the rectosigmoid junction by colonoscopy and CT scan. Just distal to the sacral promontory. Proximal rectum. Cannot be palpated. I think it is proximal enough that she doesn't necessarily need neoadjuvant chemoradiation therapy.  At some point I think she would benefit from segmental colorectal resection. Should be able to do an anastomosis proximal enough to not require  a diverting loop ileostomy  She does have a questionable liver mass. Favor hemangioma, but I agree that an MRI would be a good idea. She has one ordered for next week.  She is due to see medical oncology next week. A female. Most likely Dr. Burr Medico.  If there is no evidence of metastatic disease on the liver, proceed with surgery first.  If there is evidence of cancer in she has a solitary liver metastases, she may benefit from segmental liver resection. Would discuss on tumor board with Dr. Barry Dienes to see if concurrent or a separate procedure. If she has this metastatic disease, she may benefit starting first with chemotherapy to prove stability of disease before making that plan. Current Plans Follow Up - Call [facility] office after tests / studies doneto discuss further plans Instructed the patient to call us back after having follow-up with the medical oncologist next week. PREOP COLON - ENCOUNTER FOR PREOPERATIVE EXAMINATION FOR GENERAL SURGICAL PROCEDURE (Z01.818) Current Plans You are being scheduled for surgery- Our schedulers will call you.  You should hear from our office's scheduling department within 5 working days about the location, date, and time of surgery. We try to make accommodations for patient's preferences in scheduling surgery, but sometimes the OR schedule or the surgeon's schedule prevents [place] from making those accommodations.  If you have not heard from our office [phone]) in 5 working days, call the office and ask for your surgeon's nurse.  If you have other questions about your diagnosis, plan, or surgery, call the office and ask for your surgeon's nurse.  Written instructions provided The anatomy & physiology of the digestive tract was discussed. The pathophysiology of the colon was discussed. Natural history risks without surgery was discussed. I feel the risks of no intervention will lead to serious problems that outweigh the operative risks; therefore, I  recommended a partial colectomy to remove the pathology. Minimally invasive (Robotic/Laparoscopic) & open techniques were discussed.  Risks such as bleeding, infection, abscess, leak, reoperation, possible ostomy, hernia, heart attack, death, and other risks were discussed. I noted a good likelihood this will help address the problem. Goals of post-operative recovery were discussed as well. Need for adequate nutrition, daily bowel regimen and healthy physical activity, to optimize recovery was noted as well. We will work to minimize complications.  Educational materials were available as well. Questions were answered. The patient expresses understanding & wishes to proceed with surgery.  Pt Education - [facility] Colon Bowel Prep 2018 ERAS/Miralax/Antibiotics Started Neomycin Sulfate 500MG , 2 (two) Tablet SEE NOTE, #6, 10/23/2016, No Refill. Local Order: TAKE TWO TABLETS AT 2 PM, 3 PM, AND 10 PM THE DAY PRIOR TO SURGERY Started Flagyl 500MG , 2 (two) Tablet SEE NOTE, #6, 10/23/2016, No Refill. Local Order: Take at 2pm, 3pm, and 10pm the day prior to your colon operation Pt Education - Pamphlet Given - Laparoscopic Colorectal Surgery: discussed with patient and provided information. Pt Education - [facility] Colectomy post-op instructions: discussed with patient and provided information.  Kathryn Dean, M.D., F.A.C.S. Gastrointestinal and Minimally Invasive Surgery [facility] [address], [place], [place] [address] [phone] Main / Paging

## 2016-11-05 ENCOUNTER — Telehealth: Payer: Self-pay | Admitting: Oncology

## 2016-11-05 NOTE — Telephone Encounter (Signed)
R/s appt per 9/17 sch message - patient is aware of appt date and time.

## 2016-11-19 NOTE — Progress Notes (Signed)
10-18-16 (EPIC) CT with contrast

## 2016-11-19 NOTE — Patient Instructions (Signed)
Kathryn Dean  11/19/2016   Your procedure is scheduled on: 11-27-16  Report to [facility] Main  Entrance Take [place]  Elevators to 3rd floor to  [place] at 6:30 AM.   Call this number if you have problems the morning of surgery [phone]    Remember: ONLY 1 PERSON MAY GO WITH YOU TO SHORT STAY TO GET  READY MORNING OF [place].  Do not eat food or drink liquids :After Midnight. Please consume a Clear Liquid Diet on the day of prep to prevent dehydration.      CLEAR LIQUID DIET   Foods Allowed                                                                     Foods Excluded  Coffee and tea, regular and decaf                             liquids that you cannot  Plain Jell-O in any flavor                                             see through such as: Fruit ices (not with fruit pulp)                                     milk, soups, orange juice  Iced Popsicles                                    All solid food Carbonated beverages, regular and diet                                    Cranberry, grape and apple juices Sports drinks like Gatorade Lightly seasoned clear broth or consume(fat free) Sugar, honey syrup  Sample Menu Breakfast                                Lunch                                     Supper Cranberry juice                    Beef broth                            Chicken broth Jell-O                                     Grape juice  Apple juice Coffee or tea                        Jell-O                                      Popsicle                                                Coffee or tea                        Coffee or tea  _____________________________________________________________________    Take these medicines the morning of surgery with A SIP OF WATER: None                                You may not have any metal on your body including hair pins and              piercings  Do  not wear jewelry, make-up, lotions, powders or perfumes, deodorant             Do not wear nail polish.  Do not shave  48 hours prior to surgery.                 Do not bring valuables to the hospital. [place].  Contacts, dentures or bridgework may not be worn into surgery.  Leave suitcase in the car. After surgery it may be brought to your room.                  Please read over the following fact sheets you were given: _____________________________________________________________________             [facility] - Preparing for Surgery Before surgery, you can play an important role.  Because skin is not sterile, your skin needs to be as free of germs as possible.  You can reduce the number of germs on your skin by washing with CHG (chlorahexidine gluconate) soap before surgery.  CHG is an antiseptic cleaner which kills germs and bonds with the skin to continue killing germs even after washing. Please DO NOT use if you have an allergy to CHG or antibacterial soaps.  If your skin becomes reddened/irritated stop using the CHG and inform your nurse when you arrive at Short Stay. Do not shave (including legs and underarms) for at least 48 hours prior to the first CHG shower.  You may shave your face/neck. Please follow these instructions carefully:  1.  Shower with CHG Soap the night before surgery and the  morning of Surgery.  2.  If you choose to wash your hair, wash your hair first as usual with your  normal  shampoo.  3.  After you shampoo, rinse your hair and body thoroughly to remove the  shampoo.                           4.  Use CHG as you would any other liquid soap.  You can apply  chg directly  to the skin and wash                       Gently with a scrungie or clean washcloth.  5.  Apply the CHG Soap to your body ONLY FROM THE NECK DOWN.   Do not use on face/ open                           Wound or open sores. Avoid contact with  eyes, ears mouth and genitals (private parts).                       Wash face,  Genitals (private parts) with your normal soap.             6.  Wash thoroughly, paying special attention to the area where your surgery  will be performed.  7.  Thoroughly rinse your body with warm water from the neck down.  8.  DO NOT shower/wash with your normal soap after using and rinsing off  the CHG Soap.                9.  Pat yourself dry with a clean towel.            10.  Wear clean pajamas.            11.  Place clean sheets on your bed the night of your first shower and do not  sleep with pets. Day of Surgery : Do not apply any lotions/deodorants the morning of surgery.  Please wear clean clothes to the hospital/surgery center.  FAILURE TO FOLLOW THESE INSTRUCTIONS MAY RESULT IN THE CANCELLATION OF YOUR SURGERY PATIENT SIGNATURE_________________________________  NURSE SIGNATURE__________________________________  ________________________________________________________________________   Adam Phenix  An incentive spirometer is a tool that can help keep your lungs clear and active. This tool measures how well you are filling your lungs with each breath. Taking long deep breaths may help reverse or decrease the chance of developing breathing (pulmonary) problems (especially infection) following:  A long period of time when you are unable to move or be active. BEFORE THE PROCEDURE   If the spirometer includes an indicator to show your best effort, your nurse or respiratory therapist will set it to a desired goal.  If possible, sit up straight or lean slightly forward. Try not to slouch.  Hold the incentive spirometer in an upright position. INSTRUCTIONS FOR USE  1. Sit on the edge of your bed if possible, or sit up as far as you can in bed or on a chair. 2. Hold the incentive spirometer in an upright position. 3. Breathe out normally. 4. Place the mouthpiece in your mouth and seal your  lips tightly around it. 5. Breathe in slowly and as deeply as possible, raising the piston or the ball toward the top of the column. 6. Hold your breath for 3-5 seconds or for as long as possible. Allow the piston or ball to fall to the bottom of the column. 7. Remove the mouthpiece from your mouth and breathe out normally. 8. Rest for a few seconds and repeat Steps 1 through 7 at least 10 times every 1-2 hours when you are awake. Take your time and take a few normal breaths between deep breaths. 9. The spirometer may include an indicator to show your best effort. Use the indicator as a goal to work toward during each  repetition. 10. After each set of 10 deep breaths, practice coughing to be sure your lungs are clear. If you have an incision (the cut made at the time of surgery), support your incision when coughing by placing a pillow or rolled up towels firmly against it. Once you are able to get out of bed, walk around indoors and cough well. You may stop using the incentive spirometer when instructed by your caregiver.  RISKS AND COMPLICATIONS  Take your time so you do not get dizzy or light-headed.  If you are in pain, you may need to take or ask for pain medication before doing incentive spirometry. It is harder to take a deep breath if you are having pain. AFTER USE  Rest and breathe slowly and easily.  It can be helpful to keep track of a log of your progress. Your caregiver can provide you with a simple table to help with this. If you are using the spirometer at home, follow these instructions: [place] IF:   You are having difficultly using the spirometer.  You have trouble using the spirometer as often as instructed.  Your pain medication is not giving enough relief while using the spirometer.  You develop fever of 100.5 F (38.1 C) or higher. SEEK IMMEDIATE MEDICAL CARE IF:   You cough up bloody sputum that had not been present before.  You develop fever of 102 F  (38.9 C) or greater.  You develop worsening pain at or near the incision site. MAKE SURE YOU:   Understand these instructions.  Will watch your condition.  Will get help right away if you are not doing well or get worse. Document Released: 06/17/2006 Document Revised: 04/29/2011 Document Reviewed: 08/18/2006 ExitCare Patient Information 2014 Exit[employer], [place].   ________________________________________________________________________  WHAT IS A BLOOD TRANSFUSION? Blood Transfusion Information  A transfusion is the replacement of blood or some of its parts. Blood is made up of multiple cells which provide different functions.  Red blood cells carry oxygen and are used for blood loss replacement.  White blood cells fight against infection.  Platelets control bleeding.  Plasma helps clot blood.  Other blood products are available for specialized needs, such as hemophilia or other clotting disorders. BEFORE THE TRANSFUSION  Who gives blood for transfusions?   Healthy volunteers who are fully evaluated to make sure their blood is safe. This is blood bank blood. Transfusion therapy is the safest it has ever been in the practice of medicine. Before blood is taken from a donor, a complete history is taken to make sure that person has no history of diseases nor engages in risky social behavior (examples are intravenous drug use or sexual activity with multiple partners). The donor's travel history is screened to minimize risk of transmitting infections, such as malaria. The donated blood is tested for signs of infectious diseases, such as HIV and hepatitis. The blood is then tested to be sure it is compatible with you in order to minimize the chance of a transfusion reaction. If you or a relative donates blood, this is often done in anticipation of surgery and is not appropriate for emergency situations. It takes many days to process the donated blood. RISKS AND COMPLICATIONS Although  transfusion therapy is very safe and saves many lives, the main dangers of transfusion include:   Getting an infectious disease.  Developing a transfusion reaction. This is an allergic reaction to something in the blood you were given. Every precaution is taken to prevent this.  The decision to have a blood transfusion has been considered carefully by your caregiver before blood is given. Blood is not given unless the benefits outweigh the risks. AFTER THE TRANSFUSION  Right after receiving a blood transfusion, you will usually feel much better and more energetic. This is especially true if your red blood cells have gotten low (anemic). The transfusion raises the level of the red blood cells which carry oxygen, and this usually causes an energy increase.  The nurse administering the transfusion will monitor you carefully for complications. HOME CARE INSTRUCTIONS  No special instructions are needed after a transfusion. You may find your energy is better. Speak with your caregiver about any limitations on activity for underlying diseases you may have. SEEK MEDICAL CARE IF:   Your condition is not improving after your transfusion.  You develop redness or irritation at the intravenous (IV) site. SEEK IMMEDIATE MEDICAL CARE IF:  Any of the following symptoms occur over the next 12 hours:  Shaking chills.  You have a temperature by mouth above 102 F (38.9 C), not controlled by medicine.  Chest, back, or muscle pain.  People around you feel you are not acting correctly or are confused.  Shortness of breath or difficulty breathing.  Dizziness and fainting.  You get a rash or develop hives.  You have a decrease in urine output.  Your urine turns a dark color or changes to pink, red, or brown. Any of the following symptoms occur over the next 10 days:  You have a temperature by mouth above 102 F (38.9 C), not controlled by medicine.  Shortness of breath.  Weakness after normal  activity.  The white part of the eye turns yellow (jaundice).  You have a decrease in the amount of urine or are urinating less often.  Your urine turns a dark color or changes to pink, red, or brown. Document Released: 02/02/2000 Document Revised: 04/29/2011 Document Reviewed: 09/21/2007 [facility] Patient Information 2014 [place], [place].  _______________________________________________________________________

## 2016-11-20 ENCOUNTER — Encounter (HOSPITAL_COMMUNITY): Payer: Self-pay

## 2016-11-20 ENCOUNTER — Encounter (HOSPITAL_COMMUNITY)
Admission: RE | Admit: 2016-11-20 | Discharge: 2016-11-20 | Disposition: A | Discharge status: Home / Self Care | Payer: BLUE CROSS/BLUE SHIELD | Source: Ambulatory Visit | Attending: Surgery | Admitting: Surgery

## 2016-11-20 DIAGNOSIS — Z01818 Encounter for other preprocedural examination: Secondary | ICD-10-CM | POA: Insufficient documentation

## 2016-11-20 DIAGNOSIS — C19 Malignant neoplasm of rectosigmoid junction: Secondary | ICD-10-CM | POA: Diagnosis not present

## 2016-11-20 DIAGNOSIS — I10 Essential (primary) hypertension: Secondary | ICD-10-CM | POA: Insufficient documentation

## 2016-11-20 HISTORY — DX: Malignant (primary) neoplasm, unspecified: C80.1

## 2016-11-20 LAB — BASIC METABOLIC PANEL
ANION GAP: 8 (ref 5–15)
BUN: 14 mg/dL (ref 6–20)
CHLORIDE: 102 mmol/L (ref 101–111)
CO2: 26 mmol/L (ref 22–32)
Calcium: 9.8 mg/dL (ref 8.9–10.3)
Creatinine, Ser: 0.84 mg/dL (ref 0.44–1.00)
GFR calc Af Amer: >60 mL/min (ref >60)
GLUCOSE: 111 mg/dL — AB (ref 65–99)
POTASSIUM: 4.1 mmol/L (ref 3.5–5.1)
Sodium: 136 mmol/L (ref 135–145)

## 2016-11-20 LAB — CBC
HEMATOCRIT: 38.2 % (ref 36.0–46.0)
HEMOGLOBIN: 12.6 g/dL (ref 12.0–15.0)
MCH: 29.4 pg (ref 26.0–34.0)
MCHC: 33 g/dL (ref 30.0–36.0)
MCV: 89 fL (ref 78.0–100.0)
Platelets: 221 10*3/uL (ref 150–400)
RBC: 4.29 MIL/uL (ref 3.87–5.11)
RDW: 13.6 % (ref 11.5–15.5)
WBC: 9.4 10*3/uL (ref 4.0–10.5)

## 2016-11-20 LAB — HEMOGLOBIN A1C
Hgb A1c MFr Bld: 5.1 % (ref 4.8–5.6)
MEAN PLASMA GLUCOSE: 99.67 mg/dL

## 2016-11-20 LAB — ABO/RH: ABO/RH(D): O POS

## 2016-11-20 LAB — PREGNANCY, URINE: Preg Test, Ur: NEGATIVE

## 2016-11-20 NOTE — Consult Note (Addendum)
[place] Nurse ostomy consult note  [place] Nurse requested for preoperative stoma site marking by Dr. Johney [place].  Markings for both a temporary diverting ileostomy and a permanent colostomy are requested.  Discussed surgical procedure and stoma creation with patient.  Explained role of the [place] nurse team.  Patient declines educational booklet today.  Answered patient's other questions pertaining to lifestyle with a pouching system and the approximate duration of temporary ostomies.   Examined patient sitting and standing in order to place the markings in the patient's visual field, away from any creases or abdominal contour issues and within the rectus muscle.  Patient with soft obese abdomen, no scars. Several areas of patchy erythema consistent with psoriasis or eczema are noted.  Marked for colostomy in the LLQ  8cm to the left of the umbilicus and 2.3FT below the umbilicus.  Marked for ileostomy in the RUQ  7.5cm to the right of the umbilicus and  7.3UK above the umbilicus.  Patient's abdomen cleansed with CHG wipes at site markings, allowed to air dry prior to marking.Covered marks with thin film transparent dressing to preserve mark until date of surgery (November 27, 2016).   Thank you for inviting [place] to meet and mark this nice patient preoperatively.  [place] nursing team will not follow, but will remain available to this patient, the nursing, surgical and medical teams.  Please re-consult if a stoma is created intraoperatively.  Thanks, Maudie Flakes, MSN, RN, [place], Arther Abbott  Pager# [phone]

## 2016-11-26 MED ORDER — SODIUM CHLORIDE 0.9 % IV SOLN
INTRAVENOUS | Status: DC
  Filled 2016-11-26: qty 6

## 2016-11-26 MED ORDER — DEXTROSE 5 % IV SOLN
5.0000 mg/kg | INTRAVENOUS | Status: AC
  Administered 2016-11-27: 370 mg via INTRAVENOUS
  Filled 2016-11-26 (×2): qty 9.25

## 2016-11-27 ENCOUNTER — Inpatient Hospital Stay (HOSPITAL_COMMUNITY): Payer: BLUE CROSS/BLUE SHIELD | Admitting: Certified Registered Nurse Anesthetist

## 2016-11-27 ENCOUNTER — Encounter (HOSPITAL_COMMUNITY)
Admission: RE | Disposition: A | Discharge status: Home / Self Care | Payer: Self-pay | Source: Ambulatory Visit | Attending: Surgery

## 2016-11-27 ENCOUNTER — Inpatient Hospital Stay (HOSPITAL_COMMUNITY)
Admission: RE | Admit: 2016-11-27 | Discharge: 2016-11-29 | DRG: 331 | Disposition: A | Discharge status: Home / Self Care | Payer: BLUE CROSS/BLUE SHIELD | Source: Ambulatory Visit | Attending: Surgery | Admitting: Surgery

## 2016-11-27 ENCOUNTER — Encounter (HOSPITAL_COMMUNITY): Payer: Self-pay | Admitting: Certified Registered Nurse Anesthetist

## 2016-11-27 DIAGNOSIS — I10 Essential (primary) hypertension: Secondary | ICD-10-CM | POA: Diagnosis present

## 2016-11-27 DIAGNOSIS — C20 Malignant neoplasm of rectum: Secondary | ICD-10-CM | POA: Diagnosis present

## 2016-11-27 DIAGNOSIS — K219 Gastro-esophageal reflux disease without esophagitis: Secondary | ICD-10-CM | POA: Diagnosis present

## 2016-11-27 DIAGNOSIS — C19 Malignant neoplasm of rectosigmoid junction: Principal | ICD-10-CM | POA: Diagnosis present

## 2016-11-27 DIAGNOSIS — Z79899 Other long term (current) drug therapy: Secondary | ICD-10-CM

## 2016-11-27 DIAGNOSIS — D1803 Hemangioma of intra-abdominal structures: Secondary | ICD-10-CM | POA: Diagnosis not present

## 2016-11-27 DIAGNOSIS — K625 Hemorrhage of anus and rectum: Secondary | ICD-10-CM | POA: Diagnosis not present

## 2016-11-27 HISTORY — PX: PROCTOSCOPY: SHX2266

## 2016-11-27 HISTORY — PX: XI ROBOTIC ASSISTED LOWER ANTERIOR RESECTION: SHX6558

## 2016-11-27 LAB — TYPE AND SCREEN
ABO/RH(D): O POS
Antibody Screen: NEGATIVE

## 2016-11-27 SURGERY — RESECTION, RECTUM, LOW ANTERIOR, ROBOT-ASSISTED
Anesthesia: General | Site: Rectum

## 2016-11-27 MED ORDER — SUGAMMADEX SODIUM 200 MG/2ML IV SOLN
INTRAVENOUS | Status: AC
  Filled 2016-11-27: qty 2

## 2016-11-27 MED ORDER — ENOXAPARIN SODIUM 40 MG/0.4ML ~~LOC~~ SOLN
40.0000 mg | SUBCUTANEOUS | Status: DC
  Administered 2016-11-28 – 2016-11-29 (×2): 40 mg via SUBCUTANEOUS
  Filled 2016-11-27 (×2): qty 0.4

## 2016-11-27 MED ORDER — ONDANSETRON HCL 4 MG/2ML IJ SOLN
INTRAMUSCULAR | Status: DC | PRN
  Administered 2016-11-27: 4 mg via INTRAVENOUS

## 2016-11-27 MED ORDER — LIDOCAINE 2% (20 MG/ML) 5 ML SYRINGE
INTRAMUSCULAR | Status: AC
  Filled 2016-11-27: qty 5

## 2016-11-27 MED ORDER — CHLORHEXIDINE GLUCONATE 4 % EX LIQD: 60.0000 mL | Freq: Once | CUTANEOUS | Status: DC

## 2016-11-27 MED ORDER — CELECOXIB 200 MG PO CAPS
200.0000 mg | ORAL_CAPSULE | ORAL | Status: AC
  Administered 2016-11-27: 200 mg via ORAL
  Filled 2016-11-27: qty 1

## 2016-11-27 MED ORDER — GUAIFENESIN-DM 100-10 MG/5ML PO SYRP: 10.0000 mL | ORAL_SOLUTION | ORAL | Status: DC | PRN

## 2016-11-27 MED ORDER — CLINDAMYCIN PHOSPHATE 900 MG/50ML IV SOLN
900.0000 mg | Freq: Three times a day (TID) | INTRAVENOUS | Status: AC
  Administered 2016-11-27: 900 mg via INTRAVENOUS
  Filled 2016-11-27: qty 50

## 2016-11-27 MED ORDER — ROCURONIUM BROMIDE 50 MG/5ML IV SOSY
PREFILLED_SYRINGE | INTRAVENOUS | Status: AC
  Filled 2016-11-27: qty 5

## 2016-11-27 MED ORDER — BUPIVACAINE-EPINEPHRINE 0.25% -1:200000 IJ SOLN
INTRAMUSCULAR | Status: AC
  Filled 2016-11-27: qty 1

## 2016-11-27 MED ORDER — ONDANSETRON HCL 4 MG PO TABS: 4.0000 mg | ORAL_TABLET | Freq: Four times a day (QID) | ORAL | Status: DC | PRN

## 2016-11-27 MED ORDER — METRONIDAZOLE 500 MG PO TABS
1000.0000 mg | ORAL_TABLET | ORAL | Status: DC
  Filled 2016-11-27: qty 2

## 2016-11-27 MED ORDER — SUCCINYLCHOLINE CHLORIDE 200 MG/10ML IV SOSY
PREFILLED_SYRINGE | INTRAVENOUS | Status: DC | PRN
  Administered 2016-11-27: 100 mg via INTRAVENOUS

## 2016-11-27 MED ORDER — KETAMINE HCL-SODIUM CHLORIDE 100-0.9 MG/10ML-% IV SOSY
PREFILLED_SYRINGE | INTRAVENOUS | Status: AC
  Filled 2016-11-27: qty 10

## 2016-11-27 MED ORDER — SCOPOLAMINE 1 MG/3DAYS TD PT72
MEDICATED_PATCH | TRANSDERMAL | Status: DC | PRN
  Administered 2016-11-27: 1 via TRANSDERMAL

## 2016-11-27 MED ORDER — LIP MEDEX EX OINT
1.0000 "application " | TOPICAL_OINTMENT | Freq: Two times a day (BID) | CUTANEOUS | Status: DC
  Administered 2016-11-27 – 2016-11-29 (×5): 1 via TOPICAL
  Filled 2016-11-27 (×2): qty 7

## 2016-11-27 MED ORDER — NEOMYCIN SULFATE 500 MG PO TABS
1000.0000 mg | ORAL_TABLET | ORAL | Status: DC
  Filled 2016-11-27: qty 2

## 2016-11-27 MED ORDER — PROPOFOL 10 MG/ML IV BOLUS
INTRAVENOUS | Status: AC
  Filled 2016-11-27: qty 20

## 2016-11-27 MED ORDER — LIDOCAINE 2% (20 MG/ML) 5 ML SYRINGE
INTRAMUSCULAR | Status: DC | PRN
  Administered 2016-11-27: 100 mg via INTRAVENOUS

## 2016-11-27 MED ORDER — FENTANYL CITRATE (PF) 100 MCG/2ML IJ SOLN
25.0000 ug | INTRAMUSCULAR | Status: AC | PRN
  Administered 2016-11-27 (×6): 25 ug via INTRAVENOUS

## 2016-11-27 MED ORDER — ACETAMINOPHEN 500 MG PO TABS
1000.0000 mg | ORAL_TABLET | ORAL | Status: AC
  Administered 2016-11-27: 1000 mg via ORAL
  Filled 2016-11-27: qty 2

## 2016-11-27 MED ORDER — SCOPOLAMINE 1 MG/3DAYS TD PT72
MEDICATED_PATCH | TRANSDERMAL | Status: AC
  Filled 2016-11-27: qty 1

## 2016-11-27 MED ORDER — METOCLOPRAMIDE HCL 5 MG/ML IJ SOLN: 10.0000 mg | Freq: Once | INTRAMUSCULAR | Status: DC | PRN

## 2016-11-27 MED ORDER — ALVIMOPAN 12 MG PO CAPS
12.0000 mg | ORAL_CAPSULE | Freq: Once | ORAL | Status: AC
  Administered 2016-11-27: 12 mg via ORAL
  Filled 2016-11-27: qty 1

## 2016-11-27 MED ORDER — FENTANYL CITRATE (PF) 250 MCG/5ML IJ SOLN
INTRAMUSCULAR | Status: DC | PRN
  Administered 2016-11-27 (×7): 50 ug via INTRAVENOUS

## 2016-11-27 MED ORDER — GABAPENTIN 300 MG PO CAPS
300.0000 mg | ORAL_CAPSULE | ORAL | Status: AC
Start: 2016-11-27 — End: 2016-11-27
  Administered 2016-11-27: 300 mg via ORAL
  Filled 2016-11-27: qty 1

## 2016-11-27 MED ORDER — LACTATED RINGERS IR SOLN
Status: DC | PRN
  Administered 2016-11-27: 1000 mL

## 2016-11-27 MED ORDER — ALVIMOPAN 12 MG PO CAPS
12.0000 mg | ORAL_CAPSULE | Freq: Two times a day (BID) | ORAL | Status: DC
  Administered 2016-11-28: 12 mg via ORAL
  Filled 2016-11-27 (×2): qty 1

## 2016-11-27 MED ORDER — POLYETHYLENE GLYCOL 3350 17 GM/SCOOP PO POWD
1.0000 | Freq: Once | ORAL | Status: DC
  Filled 2016-11-27: qty 255

## 2016-11-27 MED ORDER — CLINDAMYCIN PHOSPHATE 900 MG/50ML IV SOLN
900.0000 mg | INTRAVENOUS | Status: AC
  Administered 2016-11-27: 900 mg via INTRAVENOUS

## 2016-11-27 MED ORDER — FENTANYL CITRATE (PF) 100 MCG/2ML IJ SOLN
INTRAMUSCULAR | Status: AC
  Filled 2016-11-27: qty 2

## 2016-11-27 MED ORDER — MIDAZOLAM HCL 2 MG/2ML IJ SOLN
INTRAMUSCULAR | Status: AC
  Filled 2016-11-27: qty 2

## 2016-11-27 MED ORDER — DEXAMETHASONE SODIUM PHOSPHATE 10 MG/ML IJ SOLN
INTRAMUSCULAR | Status: AC
  Filled 2016-11-27: qty 1

## 2016-11-27 MED ORDER — GABAPENTIN 300 MG PO CAPS
300.0000 mg | ORAL_CAPSULE | Freq: Two times a day (BID) | ORAL | Status: DC
  Administered 2016-11-27 – 2016-11-29 (×5): 300 mg via ORAL
  Filled 2016-11-27 (×5): qty 1

## 2016-11-27 MED ORDER — PROPOFOL 10 MG/ML IV BOLUS
INTRAVENOUS | Status: DC | PRN
  Administered 2016-11-27: 150 mg via INTRAVENOUS

## 2016-11-27 MED ORDER — DEXAMETHASONE SODIUM PHOSPHATE 10 MG/ML IJ SOLN
INTRAMUSCULAR | Status: DC | PRN
  Administered 2016-11-27: 6 mg via INTRAVENOUS

## 2016-11-27 MED ORDER — LIDOCAINE HCL 2 % IJ SOLN
INTRAMUSCULAR | Status: AC
  Filled 2016-11-27: qty 20

## 2016-11-27 MED ORDER — SUGAMMADEX SODIUM 200 MG/2ML IV SOLN
INTRAVENOUS | Status: DC | PRN
  Administered 2016-11-27: 200 mg via INTRAVENOUS

## 2016-11-27 MED ORDER — SUCCINYLCHOLINE CHLORIDE 200 MG/10ML IV SOSY
PREFILLED_SYRINGE | INTRAVENOUS | Status: AC
  Filled 2016-11-27: qty 10

## 2016-11-27 MED ORDER — EPHEDRINE 5 MG/ML INJ
INTRAVENOUS | Status: AC
  Filled 2016-11-27: qty 10

## 2016-11-27 MED ORDER — HYDROCORTISONE 1 % EX CREA
1.0000 "application " | TOPICAL_CREAM | Freq: Three times a day (TID) | CUTANEOUS | Status: DC | PRN
  Filled 2016-11-27: qty 28

## 2016-11-27 MED ORDER — ACETAMINOPHEN 500 MG PO TABS
1000.0000 mg | ORAL_TABLET | Freq: Three times a day (TID) | ORAL | Status: DC
  Administered 2016-11-27 – 2016-11-29 (×6): 1000 mg via ORAL
  Filled 2016-11-27 (×6): qty 2

## 2016-11-27 MED ORDER — MAGIC MOUTHWASH
15.0000 mL | Freq: Four times a day (QID) | ORAL | Status: DC | PRN
  Filled 2016-11-27: qty 15

## 2016-11-27 MED ORDER — FENTANYL CITRATE (PF) 100 MCG/2ML IJ SOLN
INTRAMUSCULAR | Status: AC
  Filled 2016-11-27: qty 4

## 2016-11-27 MED ORDER — SACCHAROMYCES BOULARDII 250 MG PO CAPS
250.0000 mg | ORAL_CAPSULE | Freq: Two times a day (BID) | ORAL | Status: DC
  Administered 2016-11-27 – 2016-11-29 (×4): 250 mg via ORAL
  Filled 2016-11-27 (×4): qty 1

## 2016-11-27 MED ORDER — ONDANSETRON HCL 4 MG/2ML IJ SOLN
INTRAMUSCULAR | Status: AC
  Filled 2016-11-27: qty 2

## 2016-11-27 MED ORDER — ROCURONIUM BROMIDE 50 MG/5ML IV SOSY
PREFILLED_SYRINGE | INTRAVENOUS | Status: DC | PRN
  Administered 2016-11-27: 50 mg via INTRAVENOUS
  Administered 2016-11-27 (×2): 10 mg via INTRAVENOUS

## 2016-11-27 MED ORDER — MEPERIDINE HCL 50 MG/ML IJ SOLN: 6.2500 mg | INTRAMUSCULAR | Status: DC | PRN

## 2016-11-27 MED ORDER — HYDROMORPHONE HCL 1 MG/ML IJ SOLN
0.5000 mg | INTRAMUSCULAR | Status: DC | PRN
  Administered 2016-11-27: 1 mg via INTRAVENOUS
  Filled 2016-11-27: qty 1

## 2016-11-27 MED ORDER — PHENOL 1.4 % MT LIQD
1.0000 | OROMUCOSAL | Status: DC | PRN
  Filled 2016-11-27: qty 177

## 2016-11-27 MED ORDER — HYDROCORTISONE 2.5 % RE CREA
1.0000 "application " | TOPICAL_CREAM | Freq: Four times a day (QID) | RECTAL | Status: DC | PRN
  Filled 2016-11-27: qty 28.35

## 2016-11-27 MED ORDER — ENALAPRILAT 1.25 MG/ML IV SOLN
0.6250 mg | Freq: Four times a day (QID) | INTRAVENOUS | Status: DC | PRN
  Filled 2016-11-27: qty 1

## 2016-11-27 MED ORDER — EPHEDRINE SULFATE 50 MG/ML IJ SOLN
INTRAMUSCULAR | Status: DC | PRN
  Administered 2016-11-27: 10 mg via INTRAVENOUS

## 2016-11-27 MED ORDER — ONDANSETRON HCL 4 MG/2ML IJ SOLN: 4.0000 mg | Freq: Four times a day (QID) | INTRAMUSCULAR | Status: DC | PRN

## 2016-11-27 MED ORDER — LACTATED RINGERS IV SOLN: INTRAVENOUS | Status: DC

## 2016-11-27 MED ORDER — KETAMINE HCL 10 MG/ML IJ SOLN
INTRAMUSCULAR | Status: DC | PRN
  Administered 2016-11-27: 30 mg via INTRAVENOUS

## 2016-11-27 MED ORDER — LIDOCAINE 2% (20 MG/ML) 5 ML SYRINGE
INTRAMUSCULAR | Status: DC | PRN
  Administered 2016-11-27: 1.5 mg/kg/h via INTRAVENOUS

## 2016-11-27 MED ORDER — GENTAMICIN SULFATE 40 MG/ML IJ SOLN
INTRAMUSCULAR | Status: DC | PRN
  Administered 2016-11-27: 1000 mL via INTRAPERITONEAL

## 2016-11-27 MED ORDER — DIPHENHYDRAMINE HCL 12.5 MG/5ML PO ELIX: 12.5000 mg | ORAL_SOLUTION | Freq: Four times a day (QID) | ORAL | Status: DC | PRN

## 2016-11-27 MED ORDER — ENOXAPARIN SODIUM 40 MG/0.4ML ~~LOC~~ SOLN
40.0000 mg | Freq: Once | SUBCUTANEOUS | Status: AC
  Administered 2016-11-27: 40 mg via SUBCUTANEOUS
  Filled 2016-11-27: qty 0.4

## 2016-11-27 MED ORDER — MENTHOL 3 MG MT LOZG: 1.0000 | LOZENGE | OROMUCOSAL | Status: DC | PRN

## 2016-11-27 MED ORDER — SODIUM CHLORIDE 0.9 % IV SOLN
INTRAVENOUS | Status: DC
  Administered 2016-11-27: 14:00:00 via INTRAVENOUS

## 2016-11-27 MED ORDER — BUPIVACAINE-EPINEPHRINE 0.25% -1:200000 IJ SOLN
INTRAMUSCULAR | Status: DC | PRN
  Administered 2016-11-27: 50 mL

## 2016-11-27 MED ORDER — FENTANYL CITRATE (PF) 250 MCG/5ML IJ SOLN
INTRAMUSCULAR | Status: AC
  Filled 2016-11-27: qty 5

## 2016-11-27 MED ORDER — DIPHENHYDRAMINE HCL 50 MG/ML IJ SOLN
12.5000 mg | Freq: Four times a day (QID) | INTRAMUSCULAR | Status: DC | PRN
Start: 2016-11-27 — End: 2016-11-29

## 2016-11-27 MED ORDER — METOCLOPRAMIDE HCL 5 MG/ML IJ SOLN: 5.0000 mg | Freq: Four times a day (QID) | INTRAMUSCULAR | Status: DC | PRN

## 2016-11-27 MED ORDER — CLINDAMYCIN PHOSPHATE 900 MG/50ML IV SOLN
INTRAVENOUS | Status: AC
  Filled 2016-11-27: qty 50

## 2016-11-27 MED ORDER — BISACODYL 5 MG PO TBEC
20.0000 mg | DELAYED_RELEASE_TABLET | Freq: Once | ORAL | Status: DC
  Filled 2016-11-27: qty 4

## 2016-11-27 MED ORDER — BUPIVACAINE LIPOSOME 1.3 % IJ SUSP
20.0000 mL | INTRAMUSCULAR | Status: DC
  Filled 2016-11-27: qty 20

## 2016-11-27 MED ORDER — MIDAZOLAM HCL 5 MG/5ML IJ SOLN
INTRAMUSCULAR | Status: DC | PRN
Start: 2016-11-27 — End: 2016-11-27
  Administered 2016-11-27: 2 mg via INTRAVENOUS

## 2016-11-27 MED ORDER — 0.9 % SODIUM CHLORIDE (POUR BTL) OPTIME
TOPICAL | Status: DC | PRN
  Administered 2016-11-27: 3000 mL

## 2016-11-27 MED ORDER — LACTATED RINGERS IV SOLN
INTRAVENOUS | Status: DC | PRN
  Administered 2016-11-27 (×3): via INTRAVENOUS

## 2016-11-27 MED ORDER — BUPIVACAINE LIPOSOME 1.3 % IJ SUSP
INTRAMUSCULAR | Status: DC | PRN
  Administered 2016-11-27: 20 mL

## 2016-11-27 MED ORDER — TRAMADOL HCL 50 MG PO TABS: 50.0000 mg | ORAL_TABLET | Freq: Four times a day (QID) | ORAL | 0 refills | Status: DC | PRN

## 2016-11-27 MED ORDER — METOPROLOL TARTRATE 5 MG/5ML IV SOLN: 5.0000 mg | Freq: Four times a day (QID) | INTRAVENOUS | Status: DC | PRN

## 2016-11-27 MED ORDER — LACTATED RINGERS IV SOLN: 1000.0000 mL | Freq: Three times a day (TID) | INTRAVENOUS | Status: DC | PRN

## 2016-11-27 MED ORDER — ALUM & MAG HYDROXIDE-SIMETH 200-200-20 MG/5ML PO SUSP: 30.0000 mL | Freq: Four times a day (QID) | ORAL | Status: DC | PRN

## 2016-11-27 SURGICAL SUPPLY — 102 items
APPLIER CLIP 5 13 M/L LIGAMAX5 (MISCELLANEOUS)
APPLIER CLIP ROT 10 11.4 M/L (STAPLE)
BLADE EXTENDED COATED 6.5IN (ELECTRODE) IMPLANT
CANNULA REDUC XI 12-8 STAPL (CANNULA) ×1
CANNULA REDUC XI 12-8MM STAPL (CANNULA) ×1
CANNULA REDUCER 12-8 DVNC XI (CANNULA) ×2 IMPLANT
CELLS DAT CNTRL 66122 CELL SVR (MISCELLANEOUS) IMPLANT
CHLORAPREP W/TINT 26ML (MISCELLANEOUS) ×4 IMPLANT
CLIP APPLIE 5 13 M/L LIGAMAX5 (MISCELLANEOUS) IMPLANT
CLIP APPLIE ROT 10 11.4 M/L (STAPLE) IMPLANT
CLIP VESOLOCK LG 6/CT PURPLE (CLIP) IMPLANT
CLIP VESOLOCK MED LG 6/CT (CLIP) IMPLANT
COUNTER NEEDLE 20 DBL MAG RED (NEEDLE) ×4 IMPLANT
COVER MAYO STAND STRL (DRAPES) ×8 IMPLANT
COVER SURGICAL LIGHT HANDLE (MISCELLANEOUS) ×8 IMPLANT
COVER TIP SHEARS 8 DVNC (MISCELLANEOUS) ×2 IMPLANT
COVER TIP SHEARS 8MM DA VINCI (MISCELLANEOUS) ×2
DECANTER SPIKE VIAL GLASS SM (MISCELLANEOUS) ×4 IMPLANT
DEVICE TROCAR PUNCTURE CLOSURE (ENDOMECHANICALS) IMPLANT
DRAIN CHANNEL 19F RND (DRAIN) IMPLANT
DRAPE ARM DVNC X/XI (DISPOSABLE) ×8 IMPLANT
DRAPE COLUMN DVNC XI (DISPOSABLE) ×2 IMPLANT
DRAPE DA VINCI XI ARM (DISPOSABLE) ×8
DRAPE DA VINCI XI COLUMN (DISPOSABLE) ×2
DRAPE SURG IRRIG POUCH 19X23 (DRAPES) ×4 IMPLANT
DRSG OPSITE POSTOP 4X10 (GAUZE/BANDAGES/DRESSINGS) IMPLANT
DRSG OPSITE POSTOP 4X6 (GAUZE/BANDAGES/DRESSINGS) ×4 IMPLANT
DRSG OPSITE POSTOP 4X8 (GAUZE/BANDAGES/DRESSINGS) IMPLANT
DRSG TEGADERM 2-3/8X2-3/4 SM (GAUZE/BANDAGES/DRESSINGS) ×4 IMPLANT
DRSG TEGADERM 4X4.75 (GAUZE/BANDAGES/DRESSINGS) IMPLANT
ELECT PENCIL ROCKER SW 15FT (MISCELLANEOUS) ×4 IMPLANT
ELECT REM PT RETURN 15FT ADLT (MISCELLANEOUS) ×4 IMPLANT
ENDOLOOP SUT PDS II  0 18 (SUTURE)
ENDOLOOP SUT PDS II 0 18 (SUTURE) IMPLANT
EVACUATOR SILICONE 100CC (DRAIN) IMPLANT
GAUZE SPONGE 2X2 8PLY STRL LF (GAUZE/BANDAGES/DRESSINGS) ×2 IMPLANT
GAUZE SPONGE 4X4 12PLY STRL (GAUZE/BANDAGES/DRESSINGS) IMPLANT
GLOVE ECLIPSE 8.0 STRL XLNG CF (GLOVE) ×12 IMPLANT
GLOVE INDICATOR 8.0 STRL GRN (GLOVE) ×12 IMPLANT
GOWN STRL REUS W/TWL XL LVL3 (GOWN DISPOSABLE) ×20 IMPLANT
GRASPER ENDOPATH ANVIL 10MM (MISCELLANEOUS) IMPLANT
HOLDER FOLEY CATH W/STRAP (MISCELLANEOUS) ×4 IMPLANT
IRRIG SUCT STRYKERFLOW 2 WTIP (MISCELLANEOUS) ×4
IRRIGATION SUCT STRKRFLW 2 WTP (MISCELLANEOUS) ×2 IMPLANT
KIT PROCEDURE DA VINCI SI (MISCELLANEOUS)
KIT PROCEDURE DVNC SI (MISCELLANEOUS) IMPLANT
LEGGING LITHOTOMY PAIR STRL (DRAPES) IMPLANT
LUBRICANT JELLY K Y 4OZ (MISCELLANEOUS) ×4 IMPLANT
NEEDLE INSUFFLATION 14GA 120MM (NEEDLE) ×4 IMPLANT
PACK CARDIOVASCULAR III (CUSTOM PROCEDURE TRAY) ×4 IMPLANT
PACK COLON (CUSTOM PROCEDURE TRAY) ×4 IMPLANT
PAD POSITIONING PINK XL (MISCELLANEOUS) ×4 IMPLANT
PORT LAP GEL ALEXIS MED 5-9CM (MISCELLANEOUS) ×4 IMPLANT
RTRCTR WOUND ALEXIS 18CM MED (MISCELLANEOUS)
SCISSORS LAP 5X35 DISP (ENDOMECHANICALS) ×4 IMPLANT
SEAL CANN UNIV 5-8 DVNC XI (MISCELLANEOUS) ×8 IMPLANT
SEAL XI 5MM-8MM UNIVERSAL (MISCELLANEOUS) ×8
SEALER VESSEL DA VINCI XI (MISCELLANEOUS) ×2
SEALER VESSEL EXT DVNC XI (MISCELLANEOUS) ×2 IMPLANT
SLEEVE ADV FIXATION 5X100MM (TROCAR) ×4 IMPLANT
SOLUTION ELECTROLUBE (MISCELLANEOUS) ×4 IMPLANT
SPONGE GAUZE 2X2 STER 10/PKG (GAUZE/BANDAGES/DRESSINGS) ×2
STAPLER 45 BLU RELOAD XI (STAPLE) IMPLANT
STAPLER 45 BLUE RELOAD XI (STAPLE)
STAPLER 45 GREEN RELOAD XI (STAPLE) ×4
STAPLER 45 GRN RELOAD XI (STAPLE) ×4 IMPLANT
STAPLER CANNULA SEAL DVNC XI (STAPLE) ×2 IMPLANT
STAPLER CANNULA SEAL XI (STAPLE) ×2
STAPLER CIRC ILS CVD 33MM 37CM (STAPLE) ×4 IMPLANT
STAPLER SHEATH (SHEATH) ×2
STAPLER SHEATH ENDOWRIST DVNC (SHEATH) ×2 IMPLANT
SUT MNCRL AB 4-0 PS2 18 (SUTURE) ×8 IMPLANT
SUT PDS AB 1 CTX 36 (SUTURE) ×8 IMPLANT
SUT PDS AB 1 TP1 96 (SUTURE) IMPLANT
SUT PDS AB 2-0 CT2 27 (SUTURE) IMPLANT
SUT PROLENE 0 CT 2 (SUTURE) ×4 IMPLANT
SUT PROLENE 2 0 KS (SUTURE) ×4 IMPLANT
SUT PROLENE 2 0 SH DA (SUTURE) IMPLANT
SUT SILK 2 0 (SUTURE) ×2
SUT SILK 2 0 SH CR/8 (SUTURE) ×4 IMPLANT
SUT SILK 2-0 18XBRD TIE 12 (SUTURE) ×2 IMPLANT
SUT SILK 3 0 (SUTURE) ×2
SUT SILK 3 0 SH CR/8 (SUTURE) ×4 IMPLANT
SUT SILK 3-0 18XBRD TIE 12 (SUTURE) ×2 IMPLANT
SUT V-LOC BARB 180 2/0GR6 GS22 (SUTURE)
SUT VIC AB 3-0 SH 18 (SUTURE) IMPLANT
SUT VIC AB 3-0 SH 27 (SUTURE)
SUT VIC AB 3-0 SH 27XBRD (SUTURE) IMPLANT
SUT VICRYL 0 UR6 27IN ABS (SUTURE) ×4 IMPLANT
SUTURE V-LC BRB 180 2/0GR6GS22 (SUTURE) IMPLANT
SYR 10ML LL (SYRINGE) ×4 IMPLANT
SYS LAPSCP GELPORT 120MM (MISCELLANEOUS)
SYSTEM LAPSCP GELPORT 120MM (MISCELLANEOUS) IMPLANT
TAPE UMBILICAL COTTON 1/8X30 (MISCELLANEOUS) ×4 IMPLANT
TOWEL OR 17X26 10 PK STRL BLUE (TOWEL DISPOSABLE) IMPLANT
TOWEL OR NON WOVEN STRL DISP B (DISPOSABLE) ×4 IMPLANT
TRAY FOLEY CATH 14FRSI W/METER (CATHETERS) ×4 IMPLANT
TRAY FOLEY W/METER SILVER 16FR (SET/KITS/TRAYS/PACK) IMPLANT
TROCAR ADV FIXATION 5X100MM (TROCAR) IMPLANT
TUBING CONNECTING 10 (TUBING) ×6 IMPLANT
TUBING CONNECTING 10' (TUBING) ×2
TUBING INSUFFLATION 10FT LAP (TUBING) ×4 IMPLANT

## 2016-11-27 NOTE — Transfer of Care (Signed)
Immediate Anesthesia Transfer of Care Note  Patient: GWENLYN HOTTINGER  Procedure(s) Performed: Procedure(s) with comments: XI ROBOTIC ASSISTED LOWER ANTERIOR RESECTION WITH RECTOSIGMOID RESECTION (N/A) - ERAS PATHWAY RIGID PROCTOSCOPY (N/A)  Patient Location: PACU  Anesthesia Type:General  Level of Consciousness:  sedated, patient cooperative and responds to stimulation  Airway & Oxygen Therapy:Patient Spontanous Breathing and Patient connected to face mask oxgen  Post-op Assessment:  Report given to PACU RN and Post -op Vital signs reviewed and stable  Post vital signs:  Reviewed and stable  Last Vitals:  Vitals:   11/27/16 0640  BP: 106/66  Pulse: 78  Resp: 18  Temp: 37.1 C  SpO2: 01%    Complications: No apparent anesthesia complications

## 2016-11-27 NOTE — Anesthesia Postprocedure Evaluation (Signed)
Anesthesia Post Note  Patient: DOMINIQUE RESSEL  Procedure(s) Performed: XI ROBOTIC ASSISTED LOWER ANTERIOR RESECTION WITH RECTOSIGMOID RESECTION (N/A Abdomen) RIGID PROCTOSCOPY (N/A Rectum)     Patient location during evaluation: PACU Anesthesia Type: General Level of consciousness: awake and alert Pain management: pain level controlled Vital Signs Assessment: post-procedure vital signs reviewed and stable Respiratory status: spontaneous breathing, nonlabored ventilation, respiratory function stable and patient connected to nasal cannula oxygen Cardiovascular status: blood pressure returned to baseline and stable Postop Assessment: no apparent nausea or vomiting Anesthetic complications: no    Last Vitals:  Vitals:   11/27/16 1549 11/27/16 1654  BP: 117/71 120/74  Pulse: 100 95  Resp: 12 15  Temp: 36.8 C 37 C  SpO2: 100% 100%    Last Pain:  Vitals:   11/27/16 1654  TempSrc: Oral  PainSc:                  Montez Hageman

## 2016-11-27 NOTE — H&P (View-Only) (Signed)
Kathryn Dean 10/23/2016 2:30 PM Location: [facility] Patient #: [id number] DOB: 17-Jul-1970 Married / Language: Cleophus Molt / Race: White Female   History of Present Illness Kathryn Hector MD; 10/23/2016 3:20 PM) The patient is a 46 year old female who presents with colorectal cancer. Note for "Colorectal cancer": ` ` ` Patient sent for surgical consultation at the request of Dr. Alessandra Dean  Chief Complaint: Cancer of rectosigmoid junction  The patient is a 46 year old female. She was having rectal bleeding. She underwent colonoscopy. Tubular adenoma removed more proximally. However partially circumferential lesion noted at 20 cm from the anus. Consistent with rectosigmoid junction. Suspicion raised. Biopsied and tattooed. Biopsy consistent with adenocarcinoma. Surgical consultation requested.  Patient had CT scan of chest/abdomen/pelvis. Indeterminate liver lesion on right hepatic lobe. MRI recommended. That is scheduled for next week. Medical oncology appointment requested as well  BM daily. No personal nor family history of GI/colon cancer, inflammatory bowel disease, irritable bowel syndrome, allergy such as Celiac Sprue, dietary/dairy problems, colitis, ulcers nor gastritis. No recent sick contacts/gastroenteritis. No travel outside the country. No changes in diet. No dysphagia to solids or liquids. No significant heartburn or reflux. No hematochezia, hematemesis, coffee ground emesis. No evidence of prior gastric/peptic ulceration. She does have a history of psoriasis. She is supposed to be starting methotrexate, but but held off given the recent diagnosis of cancer.  (Review of systems as stated in this history (HPI) or in the review of systems. Otherwise all other 12 point ROS are negative)   Past Surgical History (Kathryn Dean, [place]; 10/23/2016 2:31 PM) Appendectomy   Diagnostic Studies History (Kathryn Dean, [place]; 10/23/2016 2:31 PM) Mammogram   1-3 years ago  Allergies (Kathryn Dean, [place]; 10/23/2016 2:32 PM) Penicillins  Allergies Reconciled   Medication History (Kathryn Dean, [place]; 10/23/2016 2:32 PM) Lisinopril (20MG  Tablet, Oral) Active. Medications Reconciled  Social History (Kathryn Dean, [place]; 10/23/2016 2:31 PM) Tobacco use  Never smoker.  Pregnancy / Birth History (Kathryn Dean, [place]; 10/23/2016 2:31 PM) Age at menarche  32 years. Gravida  7 Length (months) of breastfeeding  7-12 Maternal age  48-30 Para  4  Other Problems (Kathryn Dean, [place]; 10/23/2016 2:31 PM) Gastroesophageal Reflux Disease     Review of Systems (Kathryn A. Brown RMA; 10/23/2016 2:31 PM) General Not Present- Appetite Loss, Chills, Fatigue, Fever, Night Sweats, Weight Gain and Weight Loss. Skin Present- Rash. Not Present- Change in Wart/Mole, Dryness, Hives, Jaundice, New Lesions, Non-Healing Wounds and Ulcer. HEENT Not Present- Earache, Hearing Loss, Hoarseness, Nose Bleed, Oral Ulcers, Ringing in the Ears, Seasonal Allergies, Sinus Pain, Sore Throat, Visual Disturbances, Wears glasses/contact lenses and Yellow Eyes. Respiratory Not Present- Bloody sputum, Chronic Cough, Difficulty Breathing, Snoring and Wheezing. Breast Not Present- Breast Mass, Breast Pain, Nipple Discharge and Skin Changes. Cardiovascular Not Present- Chest Pain, Difficulty Breathing Lying Down, Leg Cramps, Palpitations, Rapid Heart Rate, Shortness of Breath and Swelling of Extremities. Gastrointestinal Present- Bloody Stool. Not Present- Abdominal Pain, Bloating, Change in Bowel Habits, Chronic diarrhea, Constipation, Difficulty Swallowing, Excessive gas, Gets full quickly at meals, Hemorrhoids, Indigestion, Nausea, Rectal Pain and Vomiting. Female Genitourinary Not Present- Frequency, Nocturia, Painful Urination, Pelvic Pain and Urgency. Neurological Not Present- Decreased Memory, Fainting, Headaches, Numbness, Seizures, Tingling, Tremor, Trouble walking and  Weakness. Psychiatric Not Present- Anxiety, Bipolar, Change in Sleep Pattern, Depression, Fearful and Frequent crying. Endocrine Not Present- Cold Intolerance, Excessive Hunger, Hair Changes, Heat Intolerance, Hot flashes and New Diabetes. Hematology Not Present- Blood  Thinners, Easy Bruising, Excessive bleeding, Gland problems, HIV and Persistent Infections.  Vitals (Kathryn A. Brown RMA; 10/23/2016 2:32 PM) 10/23/2016 2:31 PM Weight: 217.8 lb Height: 65in Body Surface Area: 2.05 m Body Mass Index: 36.24 kg/m  Temp.: 97.7F  Pulse: 103 (Regular)  P.OX: 99% (Room air) BP: 138/82 (Sitting, Left Arm, Standard)       Physical Exam Kathryn Hector MD; 10/23/2016 2:54 PM) General Mental Status-Alert. General Appearance-Not in acute distress, Not Sickly. Orientation-Oriented X3. Hydration-Well hydrated. Voice-Normal.  Integumentary Global Assessment Upon inspection and palpation of skin surfaces of the - Axillae: non-tender, no inflammation or ulceration, no drainage. and Distribution of scalp and body hair is normal. General Characteristics Temperature - normal warmth is noted. Note: Intermittently hearing patches on abdomen consistent with psoriasis. Redness underneath pannicular fold   Head and Neck Head-normocephalic, atraumatic with no lesions or palpable masses. Face Global Assessment - atraumatic, no absence of expression. Neck Global Assessment - no abnormal movements, no bruit auscultated on the right, no bruit auscultated on the left, no decreased range of motion, non-tender. Trachea-midline. Thyroid Gland Characteristics - non-tender.  Eye Eyeball - Left-Extraocular movements intact, No Nystagmus. Eyeball - Right-Extraocular movements intact, No Nystagmus. Cornea - Left-No Hazy. Cornea - Right-No Hazy. Sclera/Conjunctiva - Left-No scleral icterus, No Discharge. Sclera/Conjunctiva - Right-No scleral icterus, No Discharge. Pupil  - Left-Direct reaction to light normal. Pupil - Right-Direct reaction to light normal.  ENMT Ears Pinna - Left - no drainage observed, no generalized tenderness observed. Right - no drainage observed, no generalized tenderness observed. Nose and Sinuses External Inspection of the Nose - no destructive lesion observed. Inspection of the nares - Left - quiet respiration. Right - quiet respiration. Mouth and Throat Lips - Upper Lip - no fissures observed, no pallor noted. Lower Lip - no fissures observed, no pallor noted. Nasopharynx - no discharge present. Oral Cavity/Oropharynx - Tongue - no dryness observed. Oral Mucosa - no cyanosis observed. Hypopharynx - no evidence of airway distress observed.  Chest and Lung Exam Inspection Movements - Normal and Symmetrical. Accessory muscles - No use of accessory muscles in breathing. Palpation Palpation of the chest reveals - Non-tender. Auscultation Breath sounds - Normal and Clear.  Cardiovascular Auscultation Rhythm - Regular. Murmurs & Other Heart Sounds - Auscultation of the heart reveals - No Murmurs and No Systolic Clicks.  Abdomen Inspection Inspection of the abdomen reveals - No Visible peristalsis and No Abnormal pulsations. Umbilicus - No Bleeding, No Urine drainage. Palpation/Percussion Palpation and Percussion of the abdomen reveal - Soft, Non Tender, No Rebound tenderness, No Rigidity (guarding) and No Cutaneous hyperesthesia. Note: Abdomen soft. Not severely distended. No distasis recti. No umbilical or other anterior abdominal wall hernias   Female Genitourinary Sexual Maturity Tanner 5 - Adult hair pattern. Note: No vaginal bleeding nor discharge   Rectal Note: Perianal skin clean with good hygiene. No pruritis ani. No pilonidal disease. No fissure. No abscess/fistula. Normal sphincter tone. No external hemorrhoids. No condyloma warts.  Tolerates digital and anoscopic rectal exam. No rectal masses felt to  9cm. Hemorrhoidal piles normal. Exam done with assistance of female Medical Assistant in the room.   Peripheral Vascular Upper Extremity Inspection - Left - No Cyanotic nailbeds, Not Ischemic. Right - No Cyanotic nailbeds, Not Ischemic.  Neurologic Neurologic evaluation reveals -normal attention span and ability to concentrate, able to name objects and repeat phrases. Appropriate fund of knowledge , normal sensation and normal coordination. Mental Status Affect - not angry, not paranoid. Cranial  Nerves-Normal Bilaterally. Gait-Normal.  Neuropsychiatric Mental status exam performed with findings of-able to articulate well with normal speech/language, rate, volume and coherence, thought content normal with ability to perform basic computations and apply abstract reasoning and no evidence of hallucinations, delusions, obsessions or homicidal/suicidal ideation.  Musculoskeletal Global Assessment Spine, Ribs and Pelvis - no instability, subluxation or laxity. Right Upper Extremity - no instability, subluxation or laxity.  Lymphatic Head & Neck  General Head & Neck Lymphatics: Bilateral - Description - No Localized lymphadenopathy. Axillary  General Axillary Region: Bilateral - Description - No Localized lymphadenopathy. Femoral & Inguinal  Generalized Femoral & Inguinal Lymphatics: Left - Description - No Localized lymphadenopathy. Right - Description - No Localized lymphadenopathy.    Assessment & Plan Kathryn Hector MD; 10/23/2016 3:21 PM) PRIMARY CANCER OF RECTOSIGMOID JUNCTION (C19) Impression: Cancer of the rectosigmoid junction by colonoscopy and CT scan. Just distal to the sacral promontory. Proximal rectum. Cannot be palpated. I think it is proximal enough that she doesn't necessarily need neoadjuvant chemoradiation therapy.  At some point I think she would benefit from segmental colorectal resection. Should be able to do an anastomosis proximal enough to not require  a diverting loop ileostomy  She does have a questionable liver mass. Favor hemangioma, but I agree that an MRI would be a good idea. She has one ordered for next week.  She is due to see medical oncology next week. A female. Most likely Dr. Burr Medico.  If there is no evidence of metastatic disease on the liver, proceed with surgery first.  If there is evidence of cancer in she has a solitary liver metastases, she may benefit from segmental liver resection. Would discuss on tumor board with Dr. Barry Dienes to see if concurrent or a separate procedure. If she has this metastatic disease, she may benefit starting first with chemotherapy to prove stability of disease before making that plan. Current Plans Follow Up - Call [facility] office after tests / studies doneto discuss further plans Instructed the patient to call us back after having follow-up with the medical oncologist next week. PREOP COLON - ENCOUNTER FOR PREOPERATIVE EXAMINATION FOR GENERAL SURGICAL PROCEDURE (Z01.818) Current Plans You are being scheduled for surgery- Our schedulers will call you.  You should hear from our office's scheduling department within 5 working days about the location, date, and time of surgery. We try to make accommodations for patient's preferences in scheduling surgery, but sometimes the OR schedule or the surgeon's schedule prevents [place] from making those accommodations.  If you have not heard from our office [phone]) in 5 working days, call the office and ask for your surgeon's nurse.  If you have other questions about your diagnosis, plan, or surgery, call the office and ask for your surgeon's nurse.  Written instructions provided The anatomy & physiology of the digestive tract was discussed. The pathophysiology of the colon was discussed. Natural history risks without surgery was discussed. I feel the risks of no intervention will lead to serious problems that outweigh the operative risks; therefore, I  recommended a partial colectomy to remove the pathology. Minimally invasive (Robotic/Laparoscopic) & open techniques were discussed.  Risks such as bleeding, infection, abscess, leak, reoperation, possible ostomy, hernia, heart attack, death, and other risks were discussed. I noted a good likelihood this will help address the problem. Goals of post-operative recovery were discussed as well. Need for adequate nutrition, daily bowel regimen and healthy physical activity, to optimize recovery was noted as well. We will work to minimize complications.  Educational materials were available as well. Questions were answered. The patient expresses understanding & wishes to proceed with surgery.  Pt Education - [facility] Colon Bowel Prep 2018 ERAS/Miralax/Antibiotics Started Neomycin Sulfate 500MG , 2 (two) Tablet SEE NOTE, #6, 10/23/2016, No Refill. Local Order: TAKE TWO TABLETS AT 2 PM, 3 PM, AND 10 PM THE DAY PRIOR TO SURGERY Started Flagyl 500MG , 2 (two) Tablet SEE NOTE, #6, 10/23/2016, No Refill. Local Order: Take at 2pm, 3pm, and 10pm the day prior to your colon operation Pt Education - Pamphlet Given - Laparoscopic Colorectal Surgery: discussed with patient and provided information. Pt Education - [facility] Colectomy post-op instructions: discussed with patient and provided information.  Kathryn Dean, M.D., F.A.C.S. Gastrointestinal and Minimally Invasive Surgery [facility] [address] [place], [place] [address] [phone] Main / Paging

## 2016-11-27 NOTE — Interval H&P Note (Signed)
History and Physical Interval Note:  11/27/2016 8:39 AM  Kathryn Dean  has presented today for surgery, with the diagnosis of rectal cancer  The various methods of treatment have been discussed with the patient and family. After consideration of risks, benefits and other options for treatment, the patient has consented to  Procedure(s): XI ROBOTIC [place] (N/A) RIGID PROCTOSCOPY (N/A) as a surgical intervention .  The patient's history has been reviewed, patient examined, no change in status, stable for surgery.  I have reviewed the patient's chart and labs.  Questions were answered to the patient's satisfaction.    I have re-reviewed the the patient's records, history, medications, and allergies.  I have re-examined the patient.  I again discussed intraoperative plans and goals of post-operative recovery.  The patient agrees to proceed.  Kathryn Dean  Dec 31, 1970 [id number]  Patient Care Team: Wenda Low, MD as PCP - General (Internal Medicine)  There are no active problems to display for this patient.   Past Medical History:  Diagnosis Date  . Cancer ([place])   . Headache(784.0)   . Hypertension    evaluated at pcp-not on any meds at this point    Past Surgical History:  Procedure Laterality Date  . APPENDECTOMY    . DILATION AND CURETTAGE OF UTERUS     x2  . VAGINAL DELIVERY      Social History   Social History  . Marital status: Married    Spouse name: N/A  . Number of children: N/A  . Years of education: N/A   Occupational History  . Not on file.   Social History Main Topics  . Smoking status: Never Smoker  . Smokeless tobacco: Never Used  . Alcohol use Yes     Comment: social  . Drug use: No  . Sexual activity: Not on file   Other Topics Concern  . Not on file   Social History Narrative  . No narrative on file    History reviewed. No pertinent family history.  Current Facility-Administered Medications   Medication Dose Route Frequency Provider Last Rate Last Dose  . bupivacaine liposome (EXPAREL) 1.3 % injection 266 mg  20 mL Infiltration On Call to OR Michael Boston, MD      . chlorhexidine (HIBICLENS) 4 % liquid 4 application  60 mL Topical Once Michael Boston, MD       And  . Derrill Memo ON 11/28/2016] chlorhexidine (HIBICLENS) 4 % liquid 4 application  60 mL Topical Once Michael Boston, MD      . clindamycin (CLEOCIN) 900 mg, gentamicin (GARAMYCIN) 240 mg in sodium chloride 0.9 % 1,000 mL for intraperitoneal lavage   Intraperitoneal To OR Michael Boston, MD      . clindamycin (CLEOCIN) 900 MG/50ML IVPB           . clindamycin (CLEOCIN) IVPB 900 mg  900 mg Intravenous 60 min Pre-Op Michael Boston, MD      . gentamicin (GARAMYCIN) 370 mg in dextrose 5 % 100 mL IVPB  5 mg/kg (Adjusted) Intravenous 60 min Pre-Op Michael Boston, MD       Facility-Administered Medications Ordered in Other Encounters  Medication Dose Route Frequency Provider Last Rate Last Dose  . lactated ringers infusion    Continuous PRN Maxwell Caul, CRNA         Allergies  Allergen Reactions  . Biaxin [Clarithromycin] Other (See Comments)    blisters  . Penicillins Rash    BP 106/66  Pulse 78   Temp 98.7 F (37.1 C) (Oral)   Resp 18   Ht 5\' 5"  (1.651 m)   Wt 98 kg (216 lb)   SpO2 99%   BMI 35.94 kg/m   Labs: No results found for this or any previous visit (from the past 48 hour(s)).  Imaging / Studies: Mr Abdomen Wwo Contrast  Result Date: 10/30/2016 CLINICAL DATA:  Evaluate liver lesion. EXAM: MRI ABDOMEN WITHOUT AND WITH CONTRAST TECHNIQUE: Multiplanar multisequence MR imaging of the abdomen was performed both before and after the administration of intravenous contrast. CONTRAST:  6mL MULTIHANCE GADOBENATE DIMEGLUMINE 529 MG/ML IV SOLN COMPARISON:  None. FINDINGS: Lower chest: No acute findings. Hepatobiliary: Within the posterior right lobe of liver there is a T2 hyperintense structure measuring 1.3 cm. This  has signal and enhancement characteristics compatible with a benign hemangioma. Pancreas: No mass, inflammatory changes, or other parenchymal abnormality identified. Spleen:  Normal appearance of the spleen. Adrenals/Urinary Tract: The adrenal glands are normal. Small bilateral kidney cysts are identified Stomach/Bowel: The stomach is normal. The small bowel loops have a knee scratch set the visualized small and large bowel loops are unremarkable. Vascular/Lymphatic: No pathologically enlarged lymph nodes identified. No abdominal aortic aneurysm demonstrated. Other:  None. Musculoskeletal: No suspicious bone lesions identified. IMPRESSION: 1. The enhancing lesion in the right lobe of liver has signal and enhancement characteristics of a benign hemangioma. No suspicious liver lesions identified at this time. 2. Kidney cysts. Electronically Signed   By: Kerby Moors M.D.   On: 10/30/2016 11:58     .[name], M.D., F.A.DeanS. Gastrointestinal and Minimally Invasive Surgery [facility] [address] [place], [place] [address] [phone] Main / Paging  11/27/2016 8:39 AM    Kathryn Horiuchi C.

## 2016-11-27 NOTE — Anesthesia Preprocedure Evaluation (Signed)
Anesthesia Evaluation  Patient identified by MRN, date of birth, ID band Patient awake    Reviewed: Allergy & Precautions, NPO status , Patient's Chart, lab work & pertinent test results  Airway Mallampati: II  TM Distance: >3 FB Neck ROM: Full    Dental no notable dental hx.    Pulmonary neg pulmonary ROS,    Pulmonary exam normal breath sounds clear to auscultation       Cardiovascular hypertension, Normal cardiovascular exam Rhythm:Regular Rate:Normal     Neuro/Psych negative neurological ROS  negative psych ROS   GI/Hepatic negative GI ROS, Neg liver ROS,   Endo/Other  negative endocrine ROS  Renal/GU negative Renal ROS  negative genitourinary   Musculoskeletal negative musculoskeletal ROS (+)   Abdominal   Peds negative pediatric ROS (+)  Hematology negative hematology ROS (+)   Anesthesia Other Findings   Reproductive/Obstetrics negative OB ROS                             Anesthesia Physical Anesthesia Plan  ASA: II  Anesthesia Plan: General   Post-op Pain Management:    Induction: Intravenous  PONV Risk Score and Plan: 4 or greater and Ondansetron, Dexamethasone, Midazolam, Scopolamine patch - Pre-op and Treatment may vary due to age or medical condition  Airway Management Planned: Oral ETT  Additional Equipment:   Intra-op Plan:   Post-operative Plan: Extubation in OR  Informed Consent: I have reviewed the patients History and Physical, chart, labs and discussed the procedure including the risks, benefits and alternatives for the proposed anesthesia with the patient or authorized representative who has indicated his/her understanding and acceptance.   Dental advisory given  Plan Discussed with: CRNA  Anesthesia Plan Comments:         Anesthesia Quick Evaluation

## 2016-11-27 NOTE — Op Note (Addendum)
11/27/2016  1:06 PM  PATIENT:  Kathryn Dean  46 y.o. female  Patient Care Team: Wenda Low, MD as PCP - General (Internal Medicine) Michael Boston, MD as Consulting Physician (General Surgery) Otis Brace, MD as Consulting Physician (Gastroenterology)  PRE-OPERATIVE DIAGNOSIS:  proximal rectal cancer  POST-OPERATIVE DIAGNOSIS:  Proximal rectal cancer  PROCEDURE:   XI ROBOTIC LOW ANTERIOR RECTOSIGMOID RESECTION  RIGID PROCTOSCOPY  SURGEON:  [name], MD  ASSISTANT: Quincy Sheehan, PA-S, [facility] Leighton Ruff, MD, FACS.  ANESTHESIA:   local and general  EBL:  Total I/O In: 2709.3 [I.V.:2600; IV Piggyback:109.3] Out: 50 [Blood:50]  Delay start of Pharmacological VTE agent (>24hrs) due to surgical blood loss or risk of bleeding:  no  DRAINS: none   SPECIMEN:  RECTOSIGMOID COLON Distal anastomotic ring for final distal margin  DISPOSITION OF SPECIMEN:  PATHOLOGY  COUNTS:  YES  PLAN OF CARE: Admit to inpatient   PATIENT DISPOSITION:  PACU - hemodynamically stable.  INDICATION:    Pleasant woman found to have mass in proximal rectum.  Biopsy consistent with adenocarcinoma. Felt proximal left to not require neoadjuvant chemoradiation therapy.  I recommended segmental resection:  The anatomy & physiology of the digestive tract was discussed.  The pathophysiology was discussed.  Natural history risks without surgery was discussed.   I worked to give an overview of the disease and the frequent need to have multispecialty involvement.  I feel the risks of no intervention will lead to serious problems that outweigh the operative risks; therefore, I recommended a partial colectomy to remove the pathology.  Laparoscopic & open techniques were discussed.   Risks such as bleeding, infection, abscess, leak, reoperation, possible ostomy, hernia, heart attack, death, and other risks were discussed.  I noted a good likelihood this will help address the  problem.   Goals of post-operative recovery were discussed as well.  We will work to minimize complications.  Educational materials on the pathology had been given in the office.  Questions were answered.    The patient expressed understanding & wished to proceed with surgery.  OR FINDINGS:   Patient had ass in proximal rectum primarily on the anterior third of the rectal circumference.  Proximal to the peritoneal reflection.  No obvious metastatic disease on visceral parietal peritoneum or liver.  The anastomosis rests 10 cm from the anal verge by rigid proctoscopy.  DESCRIPTION:   Informed consent was confirmed.  The patient underwent general anaesthesia without difficulty.  The patient was positioned appropriately.  VTE prevention in place.  The patient's abdomen was clipped, prepped, & draped in a sterile fashion.  Surgical timeout confirmed our plan.  The patient was positioned in reverse Trendelenburg.  Abdominal entry was gained using Varess technique with a trach hook on the anterior abdominal wall fascia in the left upper abdomen.  Entry was clean.  I induced carbon dioxide insufflation.  Camera inspection revealed no injury.  Extra ports were carefully placed under direct laparoscopic visualization.  I reflected the greater omentum and the upper abdomen the small bowel in the upper abdomen.  The patient was carefully positioned.  The Intuitive daVinci robot was carefully docked with camera & instruments carefully placed.  The patient had tattoo at the peritoneal reflection near thebase of the uterus.  I scored the base of peritoneum of the medial side of the mesentery of the left colon from the ligament of Treitz to the peritoneal reflection of the mid rectum.   I elevated the sigmoid  mesentery and entered into the retro-mesenteric plane. We were able to identify the left ureter and gonadal vessels. We kept those posterior within the retroperitoneum and elevated the left colon mesentery  off that. I did isolate the inferior mesenteric artery (IMA) pedicle but did not ligate it yet.  I continued distally and got into the avascular plane posterior to the mesorectum. This allowed me to help mobilize the rectum as well by freeing the mesorectum off the sacrum.  I mobilized the peritoneal coverings towards the peritoneal reflection on both the right and left sides of the rectum.  I stayed away from the right and left ureters.  I came down to the level of the tattoo and went a few centimeters more distally.  I kept the lateral vascular pedicles to the rectum intact.  I skeletonized the lymph nodes off the inferior mesenteric artery pedicle.  I went down to its takeoff from the aorta.  I isolated the inferior mesenteric vein off of the ligament of Treitz just cephalad to that as well.  After confirming the left ureter was out of the way, I went ahead and ligated the inferior mesenteric artery pedicle just near its takeoff from the aorta.  I did ligate the inferior mesenteric vein in a similar fashion.  We ensured hemostasis.   I clamped the sigmoid colon and did rigid proctoscopy.  Confirmed that the tumor was in the anterior proximal rectum. The most intense  I had good prox and distal margins. I skeletonized the mesorectum at the junction at the mid rectum for the distal point of resection.  I mobilized the left colon in a lateral to medial fashion off the line of Toldt up towards the splenic flexure to ensure good mobilization of the remaining left colon to reach into the pelvis.  Came around the splenic flexure but did not not do a total splenic flexure mobilization.  I skeletonized at the proximal mesorectum and transected at the proximal rectum using a robotic 45 mm stapler.  I chose a region at the descending/sigmoid junction that was soft and easily reached down to the rectal stump.  I transected the mesentery of the colon radially to preserve remaining colon blood supply.  I created an  extraction incision through a small Pfannenstiel incision in the suprapubic region.  Placed a wound protector.  I was able to eviscerate the rectosigmoid and descending colon out the wound.   I clamped the colon proximal to this area using a reusable pursestringer device.  Passed a 2-0 Keith needle. I transected at the descending/sigmoid junction with a scalpel. I got healthy bleeding mucosa.  We sent the rectosigmoid colon specimen off to go to pathology.  We sized the colon orifice.  I chose a 33 EEA anvil stapler system.  I reinforced the prolene pursestring with interrupted silk suture.  I placed the anvil to the open end of the proximal remaining colon and closed around it using the pursestring.    We did copious irrigation with crystalloid solution.  Hemostasis was good.  The distal end of the remaining colon easily reached down to the rectal stump, therefore, splenic flexure mobilization was not needed.     Dr Marcello Moores scrubbed down and did gentle anal dilation and advanced the EEA stapler up the rectal stump. The spike was brought out at the provimal end of the rectal stump under direct visualization.  I attached the anvil of the proximal colon the spike of the stapler. Anvil was tightened down and  held clamped for 60 seconds. The EEA stapler was fired and held clamped for 30 seconds. The stapler was released & removed. We noted 2 excellent anastomotic rings. Blue stitch is in the proximal ring.  Dr Marcello Moores did rigid proctoscopy noted the anastomosis was at 10 cm from the anal verge consistent with the proximal rectum.  We did a final irrigation of antibiotic solution (900 mg clindamycin/240 mg gentamicin in a liter of crystalloid) & held that for the pelvic air leak test .  The rectum was insufflated the rectum while clamping the colon proximal to that anastomosis.  There was a negative air leak test. There was no tension of mesentery or bowel at the anastomosis.   Tissues looked viable.  Ureters & bowel  uninjured.  The anastomosis looked healthy.  I closed the 12 mm stapler port site with a 0 Vicryl using a laparoscopic suture passer under direct visualization.  Endoluminal gas was evacuated.  Ports & wound protector removed.  We changed gloves & redraped the patient per colon SSI prevention protocol.  We aspirated the antibiotic irrigation.  Hemostasis was good.  Sterile unused instruments were used from this point.  I closed the skin at the port sites using Monocryl stitch and sterile dressing.  I closed the extraction wound using a 0 Vicryl vertical peritoneal closure and a #1 PDS transverse anterior rectal fascial closure like a small Pfannenstiel closure. I closed the skin with some interrupted Monocryl stitches. I placed antibiotic-soaked wicks into the closure at the corners x2.  I placed sterile dressings.     Patient is extubated & stable in the PACU recovery room. I had discussed postop care with the patient in detail the office & in the holding area. Instructions are written. I discussed operative findings, updated the patient's status, discussed probable steps to recovery, and gave postoperative recommendations to the patient's spouse.  Recommendations were made.  Questions were answered.  He expressed understanding & appreciation.   [name], M.D., F.A.C.S. Gastrointestinal and Minimally Invasive Surgery [facility] [address], [place], [place] [address] [phone] Main / Paging

## 2016-11-27 NOTE — Discharge Instructions (Signed)
SURGERY: POST OP INSTRUCTIONS °(Surgery for small bowel obstruction, colon resection, etc) ° ° °###################################################################### ° °EAT °Gradually transition to a high fiber diet with a fiber supplement over the next few days after discharge ° °WALK °Walk an hour a day.  Control your pain to do that.   ° °CONTROL PAIN °Control pain so that you can walk, sleep, tolerate sneezing/coughing, go up/down stairs. ° °HAVE A BOWEL MOVEMENT DAILY °Keep your bowels regular to avoid problems.  OK to try a laxative to override constipation.  OK to use an antidairrheal to slow down diarrhea.  Call if not better after 2 tries ° °CALL IF YOU HAVE PROBLEMS/CONCERNS °Call if you are still struggling despite following these instructions. °Call if you have concerns not answered by these instructions ° °###################################################################### ° ° °DIET °Follow a light diet the first few days at home.  Start with a bland diet such as soups, liquids, starchy foods, low fat foods, etc.  If you feel full, bloated, or constipated, stay on a ful liquid or pureed/blenderized diet for a few days until you feel better and no longer constipated. °Be sure to drink plenty of fluids every day to avoid getting dehydrated (feeling dizzy, not urinating, etc.). °Gradually add a fiber supplement to your diet over the next week.  Gradually get back to a regular solid diet.  Avoid fast food or heavy meals the first week as you are more likely to get nauseated. °It is expected for your digestive tract to need a few months to get back to normal.  It is common for your bowel movements and stools to be irregular.  You will have occasional bloating and cramping that should eventually fade away.  Until you are eating solid food normally, off all pain medications, and back to regular activities; your bowels will not be normal. °Focus on eating a low-fat, high fiber diet the rest of your life  (See Getting to Good Bowel Health, below). ° °CARE of your INCISION or WOUND °It is good for closed incision and even open wounds to be washed every day.  Shower every day.  Short baths are fine.  Wash the incisions and wounds clean with soap & water.    °If you have a closed incision(s), wash the incision with soap & water every day.  You may leave closed incisions open to air if it is dry.   You may cover the incision with clean gauze & replace it after your daily shower for comfort. °If you have skin tapes (Steristrips) or skin glue (Dermabond) on your incision, leave them in place.  They will fall off on their own like a scab.  You may trim any edges that curl up with clean scissors.  If you have staples, set up an appointment for them to be removed in the office in 10 days after surgery.  °If you have a drain, wash around the skin exit site with soap & water and place a new dressing of gauze or band aid around the skin every day.  Keep the drain site clean & dry.    °If you have an open wound with packing, see wound care instructions.  In general, it is encouraged that you remove your dressing and packing, shower with soap & water, and replace your dressing once a day.  Pack the wound with clean gauze moistened with normal (0.9%) saline to keep the wound moist & uninfected.  Pressure on the dressing for 30 minutes will stop most wound   bleeding.  Eventually your body will heal & pull the open wound closed over the next few months.  °Raw open wounds will occasionally bleed or secrete yellow drainage until it heals closed.  Drain sites will drain a little until the drain is removed.  Even closed incisions can have mild bleeding or drainage the first few days until the skin edges scab over & seal.   °If you have an open wound with a wound vac, see wound vac care instructions. ° ° ° ° °ACTIVITIES as tolerated °Start light daily activities --- self-care, walking, climbing stairs-- beginning the day after surgery.   Gradually increase activities as tolerated.  Control your pain to be active.  Stop when you are tired.  Ideally, walk several times a day, eventually an hour a day.   °Most people are back to most day-to-day activities in a few weeks.  It takes 4-8 weeks to get back to unrestricted, intense activity. °If you can walk 30 minutes without difficulty, it is safe to try more intense activity such as jogging, treadmill, bicycling, low-impact aerobics, swimming, etc. °Save the most intensive and strenuous activity for last (Usually 4-8 weeks after surgery) such as sit-ups, heavy lifting, contact sports, etc.  Refrain from any intense heavy lifting or straining until you are off narcotics for pain control.  You will have off days, but things should improve week-by-week. °DO NOT PUSH THROUGH PAIN.  Let pain be your guide: If it hurts to do something, don't do it.  Pain is your body warning you to avoid that activity for another week until the pain goes down. °You may drive when you are no longer taking narcotic prescription pain medication, you can comfortably wear a seatbelt, and you can safely make sudden turns/stops to protect yourself without hesitating due to pain. °You may have sexual intercourse when it is comfortable. If it hurts to do something, stop. ° °MEDICATIONS °Take your usually prescribed home medications unless otherwise directed.   °Blood thinners:  °Usually you can restart any strong blood thinners after the second postoperative day.  It is OK to take aspirin right away.    ° If you are on strong blood thinners (warfarin/Coumadin, Plavix, Xerelto, Eliquis, Pradaxa, etc), discuss with your surgeon, medicine PCP, and/or cardiologist for instructions on when to restart the blood thinner & if blood monitoring is needed (PT/INR blood check, etc).   ° ° °PAIN CONTROL °Pain after surgery or related to activity is often due to strain/injury to muscle, tendon, nerves and/or incisions.  This pain is usually  short-term and will improve in a few months.  °To help speed the process of healing and to get back to regular activity more quickly, DO THE FOLLOWING THINGS TOGETHER: °1. Increase activity gradually.  DO NOT PUSH THROUGH PAIN °2. Use Ice and/or Heat °3. Try Gentle Massage and/or Stretching °4. Take over the counter pain medication °5. Take Narcotic prescription pain medication for more severe pain ° °Good pain control = faster recovery.  It is better to take more medicine to be more active than to stay in bed all day to avoid medications. °1.  Increase activity gradually °Avoid heavy lifting at first, then increase to lifting as tolerated over the next 6 weeks. °Do not “push through” the pain.  Listen to your body and avoid positions and maneuvers than reproduce the pain.  Wait a few days before trying something more intense °Walking an hour a day is encouraged to help your body recover faster   and more safely.  Start slowly and stop when getting sore.  If you can walk 30 minutes without stopping or pain, you can try more intense activity (running, jogging, aerobics, cycling, swimming, treadmill, sex, sports, weightlifting, etc.) °Remember: If it hurts to do it, then don’t do it! °2. Use Ice and/or Heat °You will have swelling and bruising around the incisions.  This will take several weeks to resolve. °Ice packs or heating pads (6-8 times a day, 30-60 minutes at a time) will help sooth soreness & bruising. °Some people prefer to use ice alone, heat alone, or alternate between ice & heat.  Experiment and see what works best for you.  Consider trying ice for the first few days to help decrease swelling and bruising; then, switch to heat to help relax sore spots and speed recovery. °Shower every day.  Short baths are fine.  It feels good!  Keep the incisions and wounds clean with soap & water.   °3. Try Gentle Massage and/or Stretching °Massage at the area of pain many times a day °Stop if you feel pain - do not  overdo it °4. Take over the counter pain medication °This helps the muscle and nerve tissues become less irritable and calm down faster °Choose ONE of the following over-the-counter anti-inflammatory medications: °Acetaminophen 500mg tabs (Tylenol) 1-2 pills with every meal and just before bedtime (avoid if you have liver problems or if you have acetaminophen in you narcotic prescription) °Naproxen 220mg tabs (ex. Aleve, Naprosyn) 1-2 pills twice a day (avoid if you have kidney, stomach, IBD, or bleeding problems) °Ibuprofen 200mg tabs (ex. Advil, Motrin) 3-4 pills with every meal and just before bedtime (avoid if you have kidney, stomach, IBD, or bleeding problems) °Take with food/snack several times a day as directed for at least 2 weeks to help keep pain / soreness down & more manageable. °5. Take Narcotic prescription pain medication for more severe pain °A prescription for strong pain control is often given to you upon discharge (for example: oxycodone/Percocet, hydrocodone/Norco/Vicodin, or tramadol/Ultram) °Take your pain medication as prescribed. °Be mindful that most narcotic prescriptions contain Tylenol (acetaminophen) as well - avoid taking too much Tylenol. °If you are having problems/concerns with the prescription medicine (does not control pain, nausea, vomiting, rash, itching, etc.), please call us [phone] to see if we need to switch you to a different pain medicine that will work better for you and/or control your side effects better. °If you need a refill on your pain medication, you must call the office before 4 pm and on weekdays only.  By federal law, prescriptions for narcotics cannot be called into a pharmacy.  They must be filled out on paper & picked up from our office by the patient or authorized caretaker.  Prescriptions cannot be filled after 4 pm nor on weekends.   ° °WHEN TO CALL US [phone] °Severe uncontrolled or worsening pain  °Fever over 101 F (38.5 C) °Concerns with  the incision: Worsening pain, redness, rash/hives, swelling, bleeding, or drainage °Reactions / problems with new medications (itching, rash, hives, nausea, etc.) °Nausea and/or vomiting °Difficulty urinating °Difficulty breathing °Worsening fatigue, dizziness, lightheadedness, blurred vision °Other concerns °If you are not getting better after two weeks or are noticing you are getting worse, contact our office [phone] for further advice.  We may need to adjust your medications, re-evaluate you in the office, send you to the emergency room, or see what other things we can do to help. °The   clinic staff is available to answer your questions during regular business hours (8:30am-5pm).  Please don’t hesitate to call and ask to speak to one of our nurses for clinical concerns.    °A surgeon from [facility] is always on call at the hospitals 24 hours/day °If you have a medical emergency, go to the nearest emergency room or call 911. ° °FOLLOW UP in our office °One the day of your discharge from the hospital (or the next business weekday), please call [facility] to set up or confirm an appointment to see your surgeon in the office for a follow-up appointment.  Usually it is 2-3 weeks after your surgery.   °If you have skin staples at your incision(s), let the office know so we can set up a time in the office for the nurse to remove them (usually around 10 days after surgery). °Make sure that you call for appointments the day of discharge (or the next business weekday) from the hospital to ensure a convenient appointment time. °IF YOU HAVE DISABILITY OR FAMILY LEAVE FORMS, BRING THEM TO THE OFFICE FOR PROCESSING.  DO NOT GIVE THEM TO YOUR DOCTOR. ° °[facility] °[address], [address], [place], [place]  [address] ? °[phone] - Main °[phone] - Toll Free,  [phone] - Fax °[email or web address] ° °GETTING TO GOOD BOWEL HEALTH. °It is  expected for your digestive tract to need a few months to get back to normal.  It is common for your bowel movements and stools to be irregular.  You will have occasional bloating and cramping that should eventually fade away.  Until you are eating solid food normally, off all pain medications, and back to regular activities; your bowels will not be normal.   °Avoiding constipation °The goal: ONE SOFT BOWEL MOVEMENT A DAY!    °Drink plenty of fluids.  Choose water first. °TAKE A FIBER SUPPLEMENT EVERY DAY THE REST OF YOUR LIFE °During your first week back home, gradually add back a fiber supplement every day °Experiment which form you can tolerate.   There are many forms such as powders, tablets, wafers, gummies, etc °Psyllium bran (Metamucil), methylcellulose (Citrucel), Miralax or Glycolax, Benefiber, Flax Seed.  °Adjust the dose week-by-week (1/2 dose/day to 6 doses a day) until you are moving your bowels 1-2 times a day.  Cut back the dose or try a different fiber product if it is giving you problems such as diarrhea or bloating. °Sometimes a laxative is needed to help jump-start bowels if constipated until the fiber supplement can help regulate your bowels.  If you are tolerating eating & you are farting, it is okay to try a gentle laxative such as double dose MiraLax, prune juice, or Milk of Magnesia.  Avoid using laxatives too often. °Stool softeners can sometimes help counteract the constipating effects of narcotic pain medicines.  It can also cause diarrhea, so avoid using for too long. °If you are still constipated despite taking fiber daily, eating solids, and a few doses of laxatives, call our office. °Controlling diarrhea °Try drinking liquids and eating bland foods for a few days to avoid stressing your intestines further. °Avoid dairy products (especially milk & ice cream) for a short time.  The intestines often can lose the ability to digest lactose when stressed. °Avoid foods that cause gassiness or  bloating.  Typical foods include beans and other legumes, cabbage, broccoli, and dairy foods.  Avoid greasy, spicy, fast foods.  Every person has   some sensitivity to other foods, so listen to your body and avoid those foods that trigger problems for you. °Probiotics (such as active yogurt, Align, etc) may help repopulate the intestines and colon with normal bacteria and calm down a sensitive digestive tract °Adding a fiber supplement gradually can help thicken stools by absorbing excess fluid and retrain the intestines to act more normally.  Slowly increase the dose over a few weeks.  Too much fiber too soon can backfire and cause cramping & bloating. °It is okay to try and slow down diarrhea with a few doses of antidiarrheal medicines.   °Bismuth subsalicylate (ex. Kayopectate, Pepto Bismol) for a few doses can help control diarrhea.  Avoid if pregnant.   °Loperamide (Imodium) can slow down diarrhea.  Start with one tablet (2mg) first.  Avoid if you are having fevers or severe pain.  °ILEOSTOMY PATIENTS WILL HAVE CHRONIC DIARRHEA since their colon is not in use.    °Drink plenty of liquids.  You will need to drink even more glasses of water/liquid a day to avoid getting dehydrated. °Record output from your ileostomy.  Expect to empty the bag every 3-4 hours at first.  Most people with a permanent ileostomy empty their bag 4-6 times at the least.   °Use antidiarrheal medicine (especially Imodium) several times a day to avoid getting dehydrated.  Start with a dose at bedtime & breakfast.  Adjust up or down as needed.  Increase antidiarrheal medications as directed to avoid emptying the bag more than 8 times a day (every 3 hours). °Work with your wound ostomy nurse to learn care for your ostomy.  See ostomy care instructions. °TROUBLESHOOTING IRREGULAR BOWELS °1) Start with a soft & bland diet. No spicy, greasy, or fried foods.  °2) Avoid gluten/wheat or dairy products from diet to see if symptoms improve. °3) Miralax  17gm or flax seed mixed in 8oz. water or juice-daily. May use 2-4 times a day as needed. °4) Gas-X, Phazyme, etc. as needed for gas & bloating.  °5) Prilosec (omeprazole) over-the-counter as needed °6)  Consider probiotics (Align, Activa, etc) to help calm the bowels down ° °Call your doctor if you are getting worse or not getting better.  Sometimes further testing (cultures, endoscopy, X-ray studies, CT scans, bloodwork, etc.) may be needed to help diagnose and treat the cause of the diarrhea. °[facility] °[address], [address], [place], [place]  [address] °[phone] - Main.    °[phone]  - Toll Free.   [phone] - Fax °[email or web address] ° ° °

## 2016-11-27 NOTE — Anesthesia Procedure Notes (Signed)
Procedure Name: Intubation Date/Time: 11/27/2016 8:54 AM Performed by: Maxwell Caul Pre-anesthesia Checklist: Patient identified, Emergency Drugs available, Suction available and Patient being monitored Patient Re-evaluated:Patient Re-evaluated prior to induction Oxygen Delivery Method: Circle system utilized Preoxygenation: Pre-oxygenation with 100% oxygen Induction Type: IV induction Ventilation: Mask ventilation without difficulty Laryngoscope Size: Mac and 4 Grade View: Grade I Tube type: Oral Tube size: 7.5 mm Number of attempts: 1 Airway Equipment and Method: Stylet Placement Confirmation: ETT inserted through vocal cords under direct vision,  positive ETCO2 and breath sounds checked- equal and bilateral Secured at: 21 cm Tube secured with: Tape Dental Injury: Teeth and Oropharynx as per pre-operative assessment

## 2016-11-28 LAB — BASIC METABOLIC PANEL
ANION GAP: 8 (ref 5–15)
BUN: 11 mg/dL (ref 6–20)
CHLORIDE: 105 mmol/L (ref 101–111)
CO2: 24 mmol/L (ref 22–32)
Calcium: 8.9 mg/dL (ref 8.9–10.3)
Creatinine, Ser: 0.88 mg/dL (ref 0.44–1.00)
GFR calc Af Amer: >60 mL/min (ref >60)
GFR calc non Af Amer: >60 mL/min (ref >60)
GLUCOSE: 106 mg/dL — AB (ref 65–99)
POTASSIUM: 4.2 mmol/L (ref 3.5–5.1)
Sodium: 137 mmol/L (ref 135–145)

## 2016-11-28 LAB — CBC
HEMATOCRIT: 34.5 % — AB (ref 36.0–46.0)
HEMOGLOBIN: 11.1 g/dL — AB (ref 12.0–15.0)
MCH: 28.8 pg (ref 26.0–34.0)
MCHC: 32.2 g/dL (ref 30.0–36.0)
MCV: 89.4 fL (ref 78.0–100.0)
Platelets: 239 10*3/uL (ref 150–400)
RBC: 3.86 MIL/uL — ABNORMAL LOW (ref 3.87–5.11)
RDW: 13.7 % (ref 11.5–15.5)
WBC: 12.5 10*3/uL — ABNORMAL HIGH (ref 4.0–10.5)

## 2016-11-28 LAB — MAGNESIUM: Magnesium: 1.7 mg/dL (ref 1.7–2.4)

## 2016-11-28 MED ORDER — TRAMADOL HCL 50 MG PO TABS: 50.0000 mg | ORAL_TABLET | Freq: Four times a day (QID) | ORAL | Status: DC | PRN

## 2016-11-28 MED ORDER — METHOCARBAMOL 500 MG PO TABS: 1000.0000 mg | ORAL_TABLET | Freq: Four times a day (QID) | ORAL | Status: DC | PRN

## 2016-11-28 NOTE — Progress Notes (Signed)
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  ?referred CO2.  Doubt brachial stretch. -f/u pathology -HTN control -VTE prophylaxis- SCDs, etc -mobilize as tolerated to help recovery  20 minutes spent in review, evaluation, examination, counseling, and coordination of care.  More than 50% of that time was spent in counseling.  I updated the patient's status to the patient and nurse.  Recommendations were made.  Questions were answered.  They expressed understanding & appreciation.   [name], M.D., F.A.C.S. Gastrointestinal and Minimally Invasive Surgery [facility] [address] [place], [place] [phone] Main / Paging   11/28/2016    Subjective: (Chief complaint)  C/o R fingertip numness occasionally & shoulder soreness.  No weakness with lifting or handgrip Tol fulls - no  nausea Walked in hallways  Objective:  Vital signs:  Vitals:   11/27/16 1654 11/27/16 2231 11/28/16 0148 11/28/16 0504  BP: 120/74 117/71 124/67 123/72  Pulse: 95 80 65 (!) 57  Resp: 15 16 16 16   Temp: 98.6 F (37 C) 98.5 F (36.9 C) 98.5 F (36.9 C) 98.1 F (36.7 C)  TempSrc: Oral Oral Oral Oral  SpO2: 100% 98% 99% 99%  Weight:    102 kg (224 lb 13.9 oz)  Height:        Last BM Date: 11/27/16  Intake/Output   Yesterday:  10/10 0701 - 10/11 0700 In: 3435.9 [P.O.:360; I.V.:2966.7; IV Piggyback:109.3] Out: 2450 [Urine:2400; Blood:50] This shift:  No intake/output data recorded.  Bowel function:  Flatus: YES  BM:  No  Drain: (No drain)   Physical Exam:  General: Pt awake/alert/oriented x4 in no acute distress Eyes: PERRL, normal EOM.  Sclera clear.  No icterus Neuro: CN II-XII intact w/o focal sensory/motor deficits. Lymph: No head/neck/groin lymphadenopathy Psych:  No delerium/psychosis/paranoia HENT: Normocephalic, Mucus membranes moist.  No thrush Neck: Supple, No tracheal deviation Chest: No chest wall pain w good excursion CV:  Pulses intact.  Regular rhythm MS: Normal AROM mjr joints.  No obvious deformity  Abdomen: Soft.  Nondistended.  Mildly tender at incisions only.  No evidence of peritonitis.  No incarcerated hernias.  Ext:  No deformity.  No mjr edema.  No cyanosis.  BUE lifting & handgrip WNL. Skin: No petechiae / purpura  Results:   Labs: Results for orders placed or performed during the hospital encounter of 11/27/16 (from the past 48 hour(s))  Basic metabolic panel     Status: Abnormal   Collection Time: 11/28/16  5:02 AM  Result Value Ref Range   Sodium 137 135 - 145 mmol/L   Potassium 4.2 3.5 - 5.1 mmol/L   Chloride 105 101 - 111 mmol/L   CO2 24 22 - 32 mmol/L   Glucose, Bld 106 (H) 65 - 99 mg/dL   BUN 11 6 - 20 mg/dL   Creatinine, Ser 0.88 0.44 - 1.00 mg/dL   Calcium 8.9 8.9 - 10.3 mg/dL   GFR calc non Af Amer >60 >60 mL/min    GFR calc Af Amer >60 >60 mL/min    Comment: (NOTE) The eGFR has been calculated using the CKD EPI equation. This calculation has not been validated in all clinical situations. eGFR's persistently <60 mL/min signify possible Chronic Kidney Disease.    Anion gap 8 5 - 15  CBC     Status: Abnormal   Collection Time: 11/28/16  5:02 AM  Result Value Ref Range   WBC 12.5 (H) 4.0 - 10.5 K/uL   RBC 3.86 (L) 3.87 - 5.11 MIL/uL   Hemoglobin 11.1 (L) 12.0 - 15.0 g/dL   HCT 34.5 (L) 36.0 - 46.0 %   MCV 89.4 78.0 - 100.0 fL   MCH 28.8 26.0 - 34.0 pg   MCHC 32.2 30.0 - 36.0 g/dL   RDW 13.7 11.5 - 15.5 %   Platelets 239 150 - 400 K/uL  Magnesium     Status: None   Collection Time: 11/28/16  5:02 AM  Result Value Ref Range   Magnesium 1.7 1.7 - 2.4 mg/dL    Imaging / Studies: No results found.  Medications / Allergies: per chart  Antibiotics: Anti-infectives    Start     Dose/Rate Route Frequency Ordered Stop   11/27/16 1700  clindamycin (CLEOCIN) IVPB 900 mg     900 mg 100 mL/hr over 30 Minutes Intravenous Every 8 hours 11/27/16 1330 11/27/16 1722   11/27/16 1106  clindamycin (CLEOCIN) 900 mg, gentamicin (GARAMYCIN) 240 mg in sodium chloride 0.9 % 1,000 mL for intraperitoneal lavage  Status:  Discontinued       As needed 11/27/16 1106 11/27/16 1209   11/27/16 0753  clindamycin (CLEOCIN) 900 MG/50ML IVPB    Comments:  [place]   : cabinet override      11/27/16 0753 11/27/16 0856   11/27/16 0647  neomycin (MYCIFRADIN) tablet 1,000 mg  Status:  Discontinued     1,000 mg Oral 3 times per day 11/27/16 0647 11/27/16 0701   11/27/16 0647  metroNIDAZOLE (FLAGYL) tablet 1,000 mg  Status:  Discontinued     1,000 mg Oral 3 times per day 11/27/16 0647 11/27/16 0701   11/27/16 0647  clindamycin (CLEOCIN) IVPB 900 mg     900 mg 100 mL/hr over 30 Minutes Intravenous 60 min pre-op 11/27/16 0647 11/27/16 0926   11/27/16 0600  clindamycin (CLEOCIN) 900 mg, gentamicin (GARAMYCIN) 240 mg in  sodium chloride 0.9 % 1,000 mL for intraperitoneal lavage  Status:  Discontinued      Intraperitoneal To Surgery 11/26/16 1158 11/27/16 1329   11/27/16 0600  gentamicin (GARAMYCIN) 370 mg in dextrose 5 % 100 mL IVPB     5 mg/kg  73.5 kg (Adjusted) 218.5 mL/hr over 30 Minutes Intravenous 60 min pre-op 11/26/16 1206 11/27/16 1610        Note: Portions of this report may have been transcribed using voice recognition software. Every effort was made to ensure accuracy; however, inadvertent computerized transcription errors may be present.   Any transcriptional errors that  result from this process are unintentional.     [name], M.D., F.A.C.S. Gastrointestinal and Minimally Invasive Surgery [facility] [address] [place], [place] [address] [phone] Main / Paging   11/28/2016

## 2016-11-28 NOTE — Progress Notes (Signed)
Pt alert and oriented X4.  Ambulated this am.  C/o numbness on right finger tips.

## 2016-11-29 NOTE — Progress Notes (Signed)
Pharmacy Brief Note - Alvimopan (Entereg)  The standing order set for alvimopan (Entereg) now includes an automatic order to discontinue the drug after the patient has had a bowel movement. The change was approved by the [place] and the Medical Executive Committee.   This patient has had bowel movements documented by nursing. Therefore, alvimopan has been discontinued. If there are questions, please contact the pharmacy at [phone].   Thank you- Dolly Rias RPh 11/29/2016, 10:34 AM Pager [phone]

## 2016-11-29 NOTE — Progress Notes (Signed)
Discharge and medication instructions reviewed with patient and spouse. questions answered; both deny further questions. Spouse is driving patient home. Patient was given one prescription, Donne Hazel, RN

## 2016-11-29 NOTE — Discharge Summary (Signed)
Physician Discharge Summary  Patient ID: Kathryn Dean MRN: [id number] DOB/AGE: Mar 09, 1970  46 y.o.  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  Postoperatively, the patient gradually mobilized and advanced to a solid diet.  Pain and other symptoms were treated aggressively.    By the time of discharge, the patient was walking well the hallways, eating food, having flatus.  Pain was well-controlled on an oral medications.  Based on meeting discharge criteria and continuing to recover, I felt it was safe for the patient to be discharged from the hospital to further recover with close followup. Postoperative recommendations were discussed in detail.  They are written as well.  Discharged Condition: good  Disposition:  Follow-up Information    Michael Boston, MD. Schedule an appointment as soon as possible for a visit in 3 weeks.   Specialty:  General Surgery Why:  To follow up after your operation, To follow up after your hospital stay Contact information: [place] [address] [phone]           01-Home or Self Care  Discharge Instructions    Call MD for:    Complete by:  As directed    FEVER > 101.5 F  (temperatures  < 101.5 F are not significant)   Call MD for:    Complete by:  As directed    FEVER > 101.5 F  (temperatures < 101.5 F are not significant)   Call MD for:  extreme fatigue    Complete by:  As directed    Call MD for:  extreme fatigue    Complete by:  As directed    Call MD for:  persistant dizziness or light-headedness    Complete by:  As directed    Call MD for:  persistant dizziness or light-headedness    Complete by:  As directed    Call MD for:  persistant nausea and vomiting    Complete by:  As directed    Call MD for:  persistant nausea and vomiting    Complete by:  As directed    Call MD for:  redness, tenderness, or signs of infection (pain, swelling, redness, odor or green/yellow discharge around incision site)    Complete by:  As directed    Call MD for:  redness, tenderness, or signs of infection (pain, swelling, redness, odor or green/yellow discharge around incision site)    Complete by:  As directed    Call MD for:  severe uncontrolled pain    Complete by:  As directed    Call MD for:  severe uncontrolled pain    Complete by:  As directed    Diet - low sodium heart healthy    Complete by:  As directed    Follow a light diet the first few days at home.  Start with a bland diet such as soups, liquids, starchy foods, low fat foods, etc.   If you feel full, bloated, or constipated, stay on a full liquid or pureed/blenderized diet for a few days until you feel better and no longer constipated. Be sure to drink plenty of fluids every day to avoid getting dehydrated (feeling dizzy, not urinating, etc.). Gradually add a fiber supplement to your diet   Diet - low sodium heart healthy    Complete by:  As directed    Follow a light diet the first few days at home.   Start with a bland diet such as soups, liquids, starchy foods, low fat foods, etc.   If you feel full, bloated, or constipated, stay on a full liquid or pureed/blenderized diet for a few days until you feel better  and no longer constipated. Be sure to drink plenty of fluids every day to avoid getting dehydrated (feeling dizzy, not urinating, etc.). Gradually add a fiber supplement to your diet   Discharge instructions    Complete by:  As directed    See Discharge Instructions If you are not getting better after two weeks or are noticing you are getting worse, contact our office [phone] for further advice.  We may need to adjust your medications, re-evaluate you in the office, send you to the emergency room, or see what other things we can do to help. The clinic staff is available to answer your questions during regular business hours (8:30am-5pm).  Please don't hesitate to call and ask to speak to one of our nurses for clinical concerns.    A surgeon from [facility] is always on call at the hospitals 24 hours/day If you have a medical emergency, go to the nearest emergency room or call 911.   Discharge instructions    Complete by:  As directed    See Discharge Instructions If you are not getting better after two weeks or are noticing you are getting worse, contact our office [phone] for further advice.  We may need to adjust your medications, re-evaluate you in the office, send you to the emergency room, or see what other things we can do to help. The clinic staff is available to answer your questions during regular business hours (8:30am-5pm).  Please don't hesitate to call and ask to speak to one of our nurses for clinical concerns.    A surgeon from [facility] is always on call at the hospitals 24 hours/day If you have a medical emergency, go to the nearest emergency room or call 911.   Driving Restrictions    Complete by:  As directed    You may drive when you are no longer taking narcotic prescription pain medication, you can comfortably wear a seatbelt, and you can safely make sudden turns/stops to protect yourself without hesitating due to pain.   Driving  Restrictions    Complete by:  As directed    You may drive when you are no longer taking narcotic prescription pain medication, you can comfortably wear a seatbelt, and you can safely make sudden turns/stops to protect yourself without hesitating due to pain.   Increase activity slowly    Complete by:  As directed    Start light daily activities --- self-care, walking, climbing stairs- beginning the day after surgery.  Gradually increase activities as tolerated.  Control your pain to be active.  Stop when you are tired.  Ideally, walk several times a day, eventually  an hour a day.   Most people are back to most day-to-day activities in a few weeks.  It takes 4-8 weeks to get back to unrestricted, intense activity. If you can walk 30 minutes without difficulty, it is safe to try more intense activity such as jogging, treadmill, bicycling, low-impact aerobics, swimming, etc. Save the most intensive and strenuous activity for last (Usually 4-8 weeks after surgery) such as sit-ups, heavy lifting, contact sports, etc.  Refrain from any intense heavy lifting or straining until you are off narcotics for pain control.  You will have off days, but things should improve week-by-week. DO NOT PUSH THROUGH PAIN.  Let pain be your guide: If it hurts to do something, don't do it.  Pain is your body warning you to avoid that activity for another week until the pain goes down.   Increase activity slowly    Complete by:  As directed    Start light daily activities --- self-care, walking, climbing stairs- beginning the day after surgery.  Gradually increase activities as tolerated.  Control your pain to be active.  Stop when you are tired.  Ideally, walk several times a day, eventually an hour a day.   Most people are back to most day-to-day activities in a few weeks.  It takes 4-8 weeks to get back to unrestricted, intense activity. If you can walk 30 minutes without difficulty, it is safe to try more intense activity  such as jogging, treadmill, bicycling, low-impact aerobics, swimming, etc. Save the most intensive and strenuous activity for last (Usually 4-8 weeks after surgery) such as sit-ups, heavy lifting, contact sports, etc.  Refrain from any intense heavy lifting or straining until you are off narcotics for pain control.  You will have off days, but things should improve week-by-week. DO NOT PUSH THROUGH PAIN.  Let pain be your guide: If it hurts to do something, don't do it.  Pain is your body warning you to avoid that activity for another week until the pain goes down.   Lifting restrictions    Complete by:  As directed    If you can walk 30 minutes without difficulty, it is safe to try more intense activity such as jogging, treadmill, bicycling, low-impact aerobics, swimming, etc. Save the most intensive and strenuous activity for last (Usually 4-8 weeks after surgery) such as sit-ups, heavy lifting, contact sports, etc.  Refrain from any intense heavy lifting or straining until you are off narcotics for pain control.  You will have off days, but things should improve week-by-week. DO NOT PUSH THROUGH PAIN.  Let pain be your guide: If it hurts to do something, don't do it.  Pain is your body warning you to avoid that activity for another week until the pain goes down.   Lifting restrictions    Complete by:  As directed    If you can walk 30 minutes without difficulty, it is safe to try more intense activity such as jogging, treadmill, bicycling, low-impact aerobics, swimming, etc. Save the most intensive and strenuous activity for last (Usually 4-8 weeks after surgery) such as sit-ups, heavy lifting, contact sports, etc.  Refrain from any intense heavy lifting or straining until you are off narcotics for pain control.  You will have off days, but things should improve week-by-week. DO NOT PUSH THROUGH PAIN.  Let pain be your guide: If it hurts to do something, don't do it.  Pain is your body warning you to  avoid that activity for another week  until the pain goes down.   May walk up steps    Complete by:  As directed    May walk up steps    Complete by:  As directed    No wound care    Complete by:  As directed    It is good for closed incision and even open wounds to be washed every day.  Shower every day.  Short baths are fine.  Wash the incisions and wounds clean with soap & water.    If you have a closed incision(s), wash the incision with soap & water every day.  You may leave closed incisions open to air if it is dry.   You may cover the incision with clean gauze & replace it after your daily shower for comfort. If you have skin tapes (Steristrips) or skin glue (Dermabond) on your incision, leave them in place.  They will fall off on their own like a scab.  You may trim any edges that curl up with clean scissors.  If you have staples, set up an appointment for them to be removed in the office in 10 days after surgery.  If you have a drain, wash around the skin exit site with soap & water and place a new dressing of gauze or band aid around the skin every day.  Keep the drain site clean & dry.   No wound care    Complete by:  As directed    It is good for closed incision and even open wounds to be washed every day.  Shower every day.  Short baths are fine.  Wash the incisions and wounds clean with soap & water.    If you have a closed incision(s), wash the incision with soap & water every day.  You may leave closed incisions open to air if it is dry.   You may cover the incision with clean gauze & replace it after your daily shower for comfort. If you have skin tapes (Steristrips) or skin glue (Dermabond) on your incision, leave them in place.  They will fall off on their own like a scab.  You may trim any edges that curl up with clean scissors.  If you have staples, set up an appointment for them to be removed in the office in 10 days after surgery.  If you have a drain, wash around the skin exit  site with soap & water and place a new dressing of gauze or band aid around the skin every day.  Keep the drain site clean & dry.   Sexual Activity Restrictions    Complete by:  As directed    You may have sexual intercourse when it is comfortable. If it hurts to do something, stop.   Sexual Activity Restrictions    Complete by:  As directed    You may have sexual intercourse when it is comfortable. If it hurts to do something, stop.      Allergies as of 11/29/2016      Reactions   Biaxin [clarithromycin] Other (See Comments)   blisters   Penicillins Rash      Medication List    TAKE these medications   acetaminophen 500 MG tablet Commonly known as:  TYLENOL Take 1,000 mg by mouth every 6 (six) hours as needed for moderate pain or headache.   ibuprofen 200 MG tablet Commonly known as:  ADVIL,MOTRIN Take 600 mg by mouth every 6 (six) hours as needed for headache or moderate pain.  lisinopril-hydrochlorothiazide 20-12.5 MG tablet Commonly known as:  PRINZIDE,ZESTORETIC Take 1 tablet by mouth daily.   OVER THE COUNTER MEDICATION Apply 1 application topically 2 (two) times daily. MG 217 Cream   traMADol 50 MG tablet Commonly known as:  ULTRAM Take 1-2 tablets (50-100 mg total) by mouth every 6 (six) hours as needed for moderate pain or severe pain.       Significant Diagnostic Studies:  Results for orders placed or performed during the hospital encounter of 11/27/16 (from the past 72 hour(s))  Basic metabolic panel     Status: Abnormal   Collection Time: 11/28/16  5:02 AM  Result Value Ref Range   Sodium 137 135 - 145 mmol/L   Potassium 4.2 3.5 - 5.1 mmol/L   Chloride 105 101 - 111 mmol/L   CO2 24 22 - 32 mmol/L   Glucose, Bld 106 (H) 65 - 99 mg/dL   BUN 11 6 - 20 mg/dL   Creatinine, Ser 0.88 0.44 - 1.00 mg/dL   Calcium 8.9 8.9 - 10.3 mg/dL   GFR calc non Af Amer >60 >60 mL/min   GFR calc Af Amer >60 >60 mL/min    Comment: (NOTE) The eGFR has been calculated  using the CKD EPI equation. This calculation has not been validated in all clinical situations. eGFR's persistently <60 mL/min signify possible Chronic Kidney Disease.    Anion gap 8 5 - 15  CBC     Status: Abnormal   Collection Time: 11/28/16  5:02 AM  Result Value Ref Range   WBC 12.5 (H) 4.0 - 10.5 K/uL   RBC 3.86 (L) 3.87 - 5.11 MIL/uL   Hemoglobin 11.1 (L) 12.0 - 15.0 g/dL   HCT 34.5 (L) 36.0 - 46.0 %   MCV 89.4 78.0 - 100.0 fL   MCH 28.8 26.0 - 34.0 pg   MCHC 32.2 30.0 - 36.0 g/dL   RDW 13.7 11.5 - 15.5 %   Platelets 239 150 - 400 K/uL  Magnesium     Status: None   Collection Time: 11/28/16  5:02 AM  Result Value Ref Range   Magnesium 1.7 1.7 - 2.4 mg/dL    No results found.  Discharge Exam: Blood pressure 123/78, pulse 63, temperature 98.7 F (37.1 C), temperature source Oral, resp. rate 18, height 5' 5"  (1.651 m), weight 91.4 kg (201 lb 8 oz), SpO2 98 %.  General: Pt awake/alert/oriented x4 in No acute distress Eyes: PERRL, normal EOM.  Sclera clear.  No icterus Neuro: CN II-XII intact w/o focal sensory/motor deficits. Lymph: No head/neck/groin lymphadenopathy Psych:  No delerium/psychosis/paranoia HENT: Normocephalic, Mucus membranes moist.  No thrush Neck: Supple, No tracheal deviation Chest: No chest wall pain w good excursion CV:  Pulses intact.  Regular rhythm MS: Normal AROM mjr joints.  No obvious deformity Abdomen: Soft.  Nondistended.  Mildly tender at incisions only.  No evidence of peritonitis.  No incarcerated hernias. Ext:  SCDs BLE.  No mjr edema.  No cyanosis Skin: No petechiae / purpura  Past Medical History:  Diagnosis Date  . Cancer ([place])   . Headache(784.0)   . Hypertension    evaluated at pcp-not on any meds at this point    Past Surgical History:  Procedure Laterality Date  . APPENDECTOMY    . DILATION AND CURETTAGE OF UTERUS     x2  . PROCTOSCOPY N/A 11/27/2016   Procedure: RIGID PROCTOSCOPY;  Surgeon: Michael Boston, MD;   Location: [facility];  Service: General;  Laterality: N/A;  .  VAGINAL DELIVERY    . XI ROBOTIC ASSISTED LOWER ANTERIOR RESECTION N/A 11/27/2016   Procedure: XI ROBOTIC ASSISTED LOWER ANTERIOR RESECTION WITH RECTOSIGMOID RESECTION;  Surgeon: Michael Boston, MD;  Location: [facility];  Service: General;  Laterality: N/A;  ERAS PATHWAY    Social History   Social History  . Marital status: Married    Spouse name: N/A  . Number of children: N/A  . Years of education: N/A   Occupational History  . Not on file.   Social History Main Topics  . Smoking status: Never Smoker  . Smokeless tobacco: Never Used  . Alcohol use Yes     Comment: social  . Drug use: No  . Sexual activity: Not on file   Other Topics Concern  . Not on file   Social History Narrative  . No narrative on file    History reviewed. No pertinent family history.  Current Facility-Administered Medications  Medication Dose Route Frequency Provider Last Rate Last Dose  . acetaminophen (TYLENOL) tablet 1,000 mg  1,000 mg Oral TID Michael Boston, MD   1,000 mg at 11/29/16 0957  . alum & mag hydroxide-simeth (MAALOX/MYLANTA) 200-200-20 MG/5ML suspension 30 mL  30 mL Oral Q6H PRN Michael Boston, MD      . alvimopan (ENTEREG) capsule 12 mg  12 mg Oral BID Michael Boston, MD   12 mg at 11/28/16 1012  . diphenhydrAMINE (BENADRYL) 12.5 MG/5ML elixir 12.5 mg  12.5 mg Oral Q6H PRN Michael Boston, MD       Or  . diphenhydrAMINE (BENADRYL) injection 12.5 mg  12.5 mg Intravenous Q6H PRN Michael Boston, MD      . enalaprilat (VASOTEC) injection 0.625-1.25 mg  0.625-1.25 mg Intravenous Q6H PRN Michael Boston, MD      . enoxaparin (LOVENOX) injection 40 mg  40 mg Subcutaneous Q24H Michael Boston, MD   40 mg at 11/29/16 0932  . gabapentin (NEURONTIN) capsule 300 mg  300 mg Oral BID Michael Boston, MD   300 mg at 11/29/16 0958  . guaiFENesin-dextromethorphan (ROBITUSSIN DM) 100-10 MG/5ML syrup 10 mL  10 mL Oral Q4H PRN Michael Boston, MD      .  hydrocortisone (ANUSOL-HC) 2.5 % rectal cream 1 application  1 application Topical QID PRN Michael Boston, MD      . hydrocortisone cream 1 % 1 application  1 application Topical TID PRN Michael Boston, MD      . HYDROmorphone (DILAUDID) injection 0.5-2 mg  0.5-2 mg Intravenous Q2H PRN Michael Boston, MD   1 mg at 11/27/16 2007  . lactated ringers infusion 1,000 mL  1,000 mL Intravenous Q8H PRN Michael Boston, MD      . lip balm (CARMEX) ointment 1 application  1 application Topical BID Michael Boston, MD   1 application at 35/57/32 [phone]  . magic mouthwash  15 mL Oral QID PRN Michael Boston, MD      . menthol-cetylpyridinium (CEPACOL) lozenge 3 mg  1 lozenge Oral PRN Michael Boston, MD      . methocarbamol (ROBAXIN) tablet 1,000 mg  1,000 mg Oral Q6H PRN Michael Boston, MD      . metoCLOPramide (REGLAN) injection 5-10 mg  5-10 mg Intravenous Q6H PRN Michael Boston, MD      . metoprolol tartrate (LOPRESSOR) injection 5 mg  5 mg Intravenous Q6H PRN Michael Boston, MD      . ondansetron [facility]) tablet 4 mg  4 mg Oral Q6H PRN Michael Boston, MD  Or  . ondansetron (ZOFRAN) injection 4 mg  4 mg Intravenous Q6H PRN Michael Boston, MD      . phenol (CHLORASEPTIC) mouth spray 1-2 spray  1-2 spray Mouth/Throat PRN Michael Boston, MD      . saccharomyces boulardii (FLORASTOR) capsule 250 mg  250 mg Oral BID Michael Boston, MD   250 mg at 11/29/16 0958  . traMADol (ULTRAM) tablet 50-100 mg  50-100 mg Oral Q6H PRN Michael Boston, MD         Allergies  Allergen Reactions  . Biaxin [Clarithromycin] Other (See Comments)    blisters  . Penicillins Rash    Signed: Morton Peters, M.D., F.A.C.S. Gastrointestinal and Minimally Invasive Surgery [facility] [address], [place], [place] [address] [phone] Main / Paging   11/29/2016, 10:25 AM

## 2016-12-02 ENCOUNTER — Ambulatory Visit: Payer: BLUE CROSS/BLUE SHIELD | Admitting: Oncology

## 2016-12-04 DIAGNOSIS — I10 Essential (primary) hypertension: Secondary | ICD-10-CM | POA: Diagnosis not present

## 2016-12-04 DIAGNOSIS — L409 Psoriasis, unspecified: Secondary | ICD-10-CM | POA: Diagnosis not present

## 2016-12-04 DIAGNOSIS — Z23 Encounter for immunization: Secondary | ICD-10-CM | POA: Diagnosis not present

## 2016-12-04 DIAGNOSIS — C20 Malignant neoplasm of rectum: Secondary | ICD-10-CM | POA: Diagnosis not present

## 2016-12-04 DIAGNOSIS — J309 Allergic rhinitis, unspecified: Secondary | ICD-10-CM | POA: Diagnosis not present

## 2016-12-10 ENCOUNTER — Telehealth: Payer: Self-pay | Admitting: Oncology

## 2016-12-10 ENCOUNTER — Ambulatory Visit (HOSPITAL_BASED_OUTPATIENT_CLINIC_OR_DEPARTMENT_OTHER): Payer: BLUE CROSS/BLUE SHIELD | Admitting: Oncology

## 2016-12-10 VITALS — BP 140/83 | HR 73 | Temp 96.6°F | Resp 16 | Ht 65.0 in | Wt 212.3 lb

## 2016-12-10 DIAGNOSIS — C19 Malignant neoplasm of rectosigmoid junction: Secondary | ICD-10-CM | POA: Diagnosis not present

## 2016-12-10 DIAGNOSIS — L409 Psoriasis, unspecified: Secondary | ICD-10-CM

## 2016-12-10 DIAGNOSIS — K769 Liver disease, unspecified: Secondary | ICD-10-CM

## 2016-12-10 DIAGNOSIS — R911 Solitary pulmonary nodule: Secondary | ICD-10-CM | POA: Diagnosis not present

## 2016-12-10 NOTE — Telephone Encounter (Signed)
Scheduled appt per 10/23 los - Gave patient AVS and calender per los.  

## 2016-12-10 NOTE — Progress Notes (Signed)
  [facility] OFFICE PROGRESS NOTE   Diagnosis: Colon cancer  INTERVAL HISTORY:   Kathryn Dean returns as scheduled. She underwent a robotic low anterior resection by Dr. Johney Maine on 11/27/2016. She was noted to have a tumor at the proximal rectum, proximal to the peritoneal reflection. No evidence of metastatic disease. The pathology ([id number]) disease revealed well-differentiated adenocarcinoma of the rectum. Tumor invaded into perirectal tissue. No lymphovascular or perineural invasion. Resection margins were negative. 25 lymph nodes are negative for metastatic carcinoma. No macroscopic tumor perforation. The tumor returned MSI-stable with no loss of mismatch repair protein expression.  She has irregular bowel habits following surgery. Objective:  Vital signs in last 24 hours:  Blood pressure 140/83, pulse 73, temperature (!) 96.6 F (35.9 C), temperature source Oral, resp. rate 16, height '5\' 5"'$  (1.651 m), weight 212 lb 4.8 oz (96.3 kg), SpO2 99 %.    Resp: Lungs clear bilaterally Cardio: Regular rate and rhythm GI: No hepatosplenomegaly, healed surgical incision, nontender Vascular: No leg edema Skin: Psoriatic plaques over the trunk    Medications: I have reviewed the patient's current medications.  Assessment/Plan: 1. Adenocarcinoma of the rectosigmoid colon ? Colonoscopy 10/11/2016-mass at 20 cm, biopsy confirmed invasive adenocarcinoma ? CTs chest, abdomen, and pelvis 10/18/2016-indeterminate right liver lesion-probable hemangioma, focal thickening at the rectosigmoid junction, 5 mm sub-solid left lower lobe nodule ? Robotic low anterior resection 11/27/2016-stage II (T3 N0) well-differentiated adenocarcinoma of the proximal rectum (above peritoneal reflection), grade 1, no lymphovascular or perineural invasion  2. Multiple colon polyps on the colonoscopy 10/11/2016-sessile ulcerated polyp and tubular adenoma  3.   Psoriasis    Disposition:  Kathryn Dean  underwent a low anterior resection for treatment of a tumor at the high rectum. Dr. Johney Maine confirmed the tumor was above the peritoneal reflection. She has been diagnosed with stage II colorectal cancer. I reviewed the details of the surgical pathology report and discussed adjuvant treatment options with Kathryn Dean. She has a good prognosis for a long-term disease-free survival. The tumor does not have "high risk "features.  We discussed diet and exercise maneuvers that may decrease the risk of developing colorectal cancer. She should undergo a colonoscopy in one year. She does not have hereditary non-polyposis colorectal cancer syndrome, but her family members are at increased risk of developing colorectal cancer. She will alert family members so they can receive appropriate screening.  Her case was presented at the GI tumor conference. There is no recommendation for adjuvant therapy. I will present her case to radiation oncology to be sure adjuvant radiation is not indicated.  Kathryn Dean will return for an office visit and CEA in 6 months.  25 minutes were spent with the patient today. The majority of the time was used for counseling and coordination of care.   [name], MD  12/10/2016  11:05 AM

## 2016-12-12 ENCOUNTER — Telehealth: Payer: Self-pay | Admitting: *Deleted

## 2016-12-12 ENCOUNTER — Ambulatory Visit: Payer: BLUE CROSS/BLUE SHIELD | Admitting: Oncology

## 2016-12-12 NOTE — Telephone Encounter (Signed)
LVM for patient requesting she return call to research nurse to discuss participation in a study.  Dr. Benay Spice identified patient as a possible candidate for the [id number] financial toxicity study.   Foye Spurling, BSN, RN Clinical Research Nurse 12/12/2016 9:37 AM

## 2016-12-12 NOTE — Telephone Encounter (Signed)
Patient returned call regarding this study.  Briefly explained the purpose of this study is to better understand the impact of cancer treatment on a patient's employment and finances.  It involves completing questionnaires at 5 time points over 24 months.  Offered to mail consent form to patient for her review, but she offered to come into the clinic to discuss in person next week.  She agreed to come in to meet with research nurse on Monday 10/29 at 9 am.  Patient will call research nurse when she arrives to parking and nurse will meet her in the front lobby.  Thanked patient very much for taking the time to call back and consider this study.  Look forward to meeting her on Monday.  She verbalized understanding.  Foye Spurling, BSN, RN Clinical Research Nurse 12/12/2016 4:48 PM

## 2016-12-16 ENCOUNTER — Encounter: Payer: Self-pay | Admitting: *Deleted

## 2016-12-30 DIAGNOSIS — Z6836 Body mass index (BMI) 36.0-36.9, adult: Secondary | ICD-10-CM | POA: Diagnosis not present

## 2016-12-30 DIAGNOSIS — Z01419 Encounter for gynecological examination (general) (routine) without abnormal findings: Secondary | ICD-10-CM | POA: Diagnosis not present

## 2017-02-04 DIAGNOSIS — R195 Other fecal abnormalities: Secondary | ICD-10-CM | POA: Diagnosis not present

## 2017-02-04 DIAGNOSIS — C20 Malignant neoplasm of rectum: Secondary | ICD-10-CM | POA: Diagnosis not present

## 2017-02-04 DIAGNOSIS — R5383 Other fatigue: Secondary | ICD-10-CM | POA: Diagnosis not present

## 2017-02-10 DIAGNOSIS — R197 Diarrhea, unspecified: Secondary | ICD-10-CM | POA: Diagnosis not present

## 2017-02-10 DIAGNOSIS — R195 Other fecal abnormalities: Secondary | ICD-10-CM | POA: Diagnosis not present

## 2017-03-17 ENCOUNTER — Encounter: Payer: Self-pay | Admitting: *Deleted

## 2017-03-17 DIAGNOSIS — C19 Malignant neoplasm of rectosigmoid junction: Secondary | ICD-10-CM

## 2017-03-17 NOTE — Progress Notes (Signed)
3 Month Follow Up Call for [id number] Study:  03/17/17 Left VM for patient requesting she call research nurse back for 3 month follow up on study.  Research nurse would like to assess how patient is doing overall and if she has received email to complete study survey or having any problems with completing the survey.  Asked patient to return my call at her convenience.  Foye Spurling, BSN, RN Clinical Research Nurse 03/17/2017 10:46 AM   03/18/17 LVM for patient requesting she call research nurse back to complete 3 month follow up on study.  Foye Spurling, BSN, RN Clinical Research Nurse 03/18/2017 2:41 PM   03/20/17 LVM for patient again to request call back to complete 3 month follow up for study.   Asked her to leave this nurse a VM if I can't answer about when would be the best time to call.  I can call her before or after work or on her lunch hour.  Thanked patient in advance for her time.  Foye Spurling, BSN, RN Clinical Research Nurse 03/20/2017 9:27 AM   03/25/17 LVM for patient requesting call back to complete 3 month follow up for this study.  Asked patient to please call nurse back when she has time and if she gets my VM to please let me know when it is a good time to call her back.  Foye Spurling, BSN, RN Clinical Research Nurse 03/25/2017 2:33 PM   Patient returned nurse's call.  She states she did receive study survey and completed it online. She did not have any questions about this.  Patient states no change in insurance and no change in her treatment plan related to colon cancer.  She continues to work full time and continues to be fully active and able to carry on all previous disease performance without restriction (ECOG =1).  She did have C-diff but has been treated and is recovering.  She is scheduled to see Dr. Benay Spice in April for 6 month follow up visit.  She will also be due for her 6 month follow up on this study close to that date so research nurse will plan to meet patient  briefly on that visit.  Encouraged patient to call research nurse prior to next appointment if any questions or concerns related to study.  Thanked patient very much for her time today and returning my call, along with completing study survey.  She verbalized understanding.  Foye Spurling, BSN, RN Clinical Research Nurse 03/25/2017 3:06 PM

## 2017-06-05 ENCOUNTER — Encounter: Payer: Self-pay | Admitting: Genetic Counselor

## 2017-06-10 ENCOUNTER — Other Ambulatory Visit: Payer: Self-pay | Admitting: Obstetrics and Gynecology

## 2017-06-10 ENCOUNTER — Telehealth: Payer: Self-pay

## 2017-06-10 ENCOUNTER — Inpatient Hospital Stay: Payer: BLUE CROSS/BLUE SHIELD

## 2017-06-10 ENCOUNTER — Encounter: Payer: Self-pay | Admitting: *Deleted

## 2017-06-10 ENCOUNTER — Inpatient Hospital Stay: Payer: BLUE CROSS/BLUE SHIELD | Attending: Oncology | Admitting: Oncology

## 2017-06-10 VITALS — BP 138/80 | HR 72 | Temp 98.1°F | Resp 17 | Ht 65.0 in | Wt 217.1 lb

## 2017-06-10 DIAGNOSIS — Z1231 Encounter for screening mammogram for malignant neoplasm of breast: Secondary | ICD-10-CM

## 2017-06-10 DIAGNOSIS — C187 Malignant neoplasm of sigmoid colon: Secondary | ICD-10-CM | POA: Diagnosis not present

## 2017-06-10 DIAGNOSIS — K769 Liver disease, unspecified: Secondary | ICD-10-CM | POA: Diagnosis not present

## 2017-06-10 DIAGNOSIS — L409 Psoriasis, unspecified: Secondary | ICD-10-CM | POA: Insufficient documentation

## 2017-06-10 DIAGNOSIS — C19 Malignant neoplasm of rectosigmoid junction: Secondary | ICD-10-CM

## 2017-06-10 LAB — CEA (IN HOUSE-CHCC): CEA (CHCC-In House): 1.03 ng/mL (ref 0.00–5.00)

## 2017-06-10 NOTE — Telephone Encounter (Signed)
Called spoke with patient concerning follow up visit was need with her lab. And had to move the entire appointment to the 22nd instead. Per 4/23 los

## 2017-06-10 NOTE — Progress Notes (Signed)
6 MONTH FOLLOW UP FOR EAQ162CD STUDY; Met with patient in lobby prior to her appointment with Dr. Benay Spice today.  Informed her that she is coming up on 6 months for this study.  The study should be sending her email to complete a 6 month survey in about one week.  Asked patient if she has had any changes to her condition or any changes to her insurance status since last contact. Patient states she is feeling about the same, denies any changes with her insurance or her condition. Thanked patient for her ongoing participation in this study. Instructed her to call research nurse if any questions or concerns about the study before our next contact. She verbalized understanding.  Foye Spurling, BSN, RN Clinical Research Nurse 06/10/2017 9:12 AM  Patient saw Dr. Benay Spice today and no changes to treatment plan.  Patient to follow up in 6 months.  Dr. Benay Spice agrees patient's ECOG is 0. Foye Spurling, BSN, RN Clinical Research Nurse 06/11/2017 9:12 AM

## 2017-06-10 NOTE — Progress Notes (Signed)
  [place] OFFICE PROGRESS NOTE   Diagnosis: Colon cancer  INTERVAL HISTORY:   [name] returns as scheduled.  She feels well.  She has several bowel movements per day.  She was treated for C. difficile colitis after the colon cancer surgery.  She is participating in the colorectal cancer financial toxicity study.  Her psoriasis has progressed.  She is currently not on treatment for psoriasis.  Objective:  Vital signs in last 24 hours:  Blood pressure 138/80, pulse 72, temperature 98.1 F (36.7 C), temperature source Oral, resp. rate 17, height '5\' 5"'$  (1.651 m), weight 217 lb 1.6 oz (98.5 kg), SpO2 96 %.    HEENT: Neck without mass Lymphatics: No cervical, supraclavicular, axillary, or inguinal nodes Resp: Lungs clear bilaterally Cardio: Regular rate and rhythm GI: No hepatosplenomegaly, no mass, nontender Vascular: No leg edema Skin: Multiple psoriatic plaques over the trunk and extremities   Lab Results:  CEA pending Medications: I have reviewed the patient's current medications.   Assessment/Plan: 1. Adenocarcinoma of the rectosigmoid colon ? Colonoscopy 10/11/2016-mass at 20 cm, biopsy confirmed invasive adenocarcinoma ? CTs chest, abdomen, and pelvis 10/18/2016-indeterminate right liver lesion-probable hemangioma, focal thickening at the rectosigmoid junction, 5 mm sub-solid left lower lobe nodule ? Robotic low anterior resection 11/27/2016-stage II (T3 N0) well-differentiated adenocarcinoma of the proximal rectum (above peritoneal reflection), grade 1, no lymphovascular or perineural invasion, no loss of mismatch repair protein expression, MSI-stable  2. Multiple colon polyps on the colonoscopy 10/11/2016-sessile ulcerated polyp and tubular adenoma  3. Psoriasis   Disposition: Kathryn Dean is in clinical remission from colon cancer.  We will follow-up on the CEA from today.  She will return for an office visit and CEA in 6 months.  She will  schedule a colonoscopy in the fall of this year.  She agrees to a genetics counseling referral.  15 minutes were spent with the patient today.  The majority of the time was used for counseling and coordination of care.  Betsy Coder, MD  06/10/2017  9:20 AM

## 2017-06-10 NOTE — Telephone Encounter (Signed)
Printed avs and calender of upcoming appointment.per 4/23 los

## 2017-06-11 ENCOUNTER — Telehealth: Payer: Self-pay

## 2017-06-11 NOTE — Telephone Encounter (Signed)
-----   Message from Ladell Pier, MD sent at 06/10/2017  3:03 PM EDT ----- Please call patient, the CEA is normal

## 2017-06-11 NOTE — Telephone Encounter (Signed)
Notified patient per Dr. Burr Medico blood work was normal, patient verbalized an understanding.

## 2017-06-12 ENCOUNTER — Telehealth: Payer: Self-pay | Admitting: Oncology

## 2017-06-12 DIAGNOSIS — Z0000 Encounter for general adult medical examination without abnormal findings: Secondary | ICD-10-CM | POA: Diagnosis not present

## 2017-06-12 DIAGNOSIS — Z1389 Encounter for screening for other disorder: Secondary | ICD-10-CM | POA: Diagnosis not present

## 2017-06-12 DIAGNOSIS — R7309 Other abnormal glucose: Secondary | ICD-10-CM | POA: Diagnosis not present

## 2017-06-12 DIAGNOSIS — Z136 Encounter for screening for cardiovascular disorders: Secondary | ICD-10-CM | POA: Diagnosis not present

## 2017-06-12 NOTE — Telephone Encounter (Signed)
Scheduled appt per 4/23 sch message - pt is aware of appt date and time - scheduled in June - pt preference - sent reminder letter in the mail.

## 2017-07-04 ENCOUNTER — Ambulatory Visit
Admission: RE | Admit: 2017-07-04 | Discharge: 2017-07-04 | Disposition: A | Discharge status: Home / Self Care | Payer: BLUE CROSS/BLUE SHIELD | Source: Ambulatory Visit | Attending: Obstetrics and Gynecology | Admitting: Obstetrics and Gynecology

## 2017-07-04 DIAGNOSIS — Z1231 Encounter for screening mammogram for malignant neoplasm of breast: Secondary | ICD-10-CM | POA: Diagnosis not present

## 2017-07-29 ENCOUNTER — Telehealth: Payer: Self-pay | Admitting: Oncology

## 2017-07-29 NOTE — Telephone Encounter (Signed)
Patient called to cancel °

## 2017-07-31 ENCOUNTER — Inpatient Hospital Stay: Payer: BLUE CROSS/BLUE SHIELD | Admitting: Genetics

## 2017-07-31 ENCOUNTER — Inpatient Hospital Stay: Payer: BLUE CROSS/BLUE SHIELD

## 2017-09-03 DIAGNOSIS — Z8619 Personal history of other infectious and parasitic diseases: Secondary | ICD-10-CM | POA: Diagnosis not present

## 2017-09-03 DIAGNOSIS — C189 Malignant neoplasm of colon, unspecified: Secondary | ICD-10-CM | POA: Diagnosis not present

## 2017-12-09 ENCOUNTER — Inpatient Hospital Stay: Payer: BLUE CROSS/BLUE SHIELD

## 2017-12-09 ENCOUNTER — Inpatient Hospital Stay: Payer: BLUE CROSS/BLUE SHIELD | Attending: Oncology | Admitting: Oncology

## 2017-12-09 ENCOUNTER — Encounter: Payer: Self-pay | Admitting: *Deleted

## 2017-12-09 VITALS — BP 153/103 | HR 77 | Temp 98.2°F | Resp 17 | Ht 65.0 in | Wt 220.4 lb

## 2017-12-09 DIAGNOSIS — L409 Psoriasis, unspecified: Secondary | ICD-10-CM | POA: Insufficient documentation

## 2017-12-09 DIAGNOSIS — C20 Malignant neoplasm of rectum: Secondary | ICD-10-CM

## 2017-12-09 DIAGNOSIS — C19 Malignant neoplasm of rectosigmoid junction: Secondary | ICD-10-CM

## 2017-12-09 DIAGNOSIS — C187 Malignant neoplasm of sigmoid colon: Secondary | ICD-10-CM | POA: Diagnosis not present

## 2017-12-09 LAB — CEA (IN HOUSE-CHCC)

## 2017-12-09 NOTE — Research (Signed)
[place] STUDY; Met with patient in lobby prior to her appointment with Dr. Benay Spice today.  Informed patient that she is coming up on 12 months for this study.  [employer] should be sending her email to complete a 12 month survey tomorrow, but it looks like she has not completed the 6 month survey yet when I look at the [facility] study portal.  This was due in May.  Patient requested the study email her the link to the survey again and she will complete one or both of them depending on what the study wants.  Informed patient I will contact the study to request they send her a new link.  Patient states she is feeling well with no symptoms.  She continues to work full time at [employer].  ECOG level assessed as 0 and Dr. Benay Spice agrees.  Patient denies any changes with her insurance coverage. Thanked patient for her ongoing participation in this study. Instructed her to call research nurse if any questions or concerns about the study before our next contact. She verbalized understanding.  Foye Spurling, BSN, RN Clinical Research Nurse 12/09/2017 11:22 AM  Emailed the [employer] [facility] to request they email patient another link to complete the 6 month survey.  Received phone call from [facility] at [place] and she states she emailed a link and password reset to patient for her 6 month survey which is still available.  She confirms patient can still complete it and then also complete the 12 month survey when that becomes available.  I called patient to notify her of above and thanked her again for completing the surveys. Asked patient to call research nurse back if any problems. She verbalized understanding.  Foye Spurling, BSN, RN Clinical Research Nurse 12/09/2017 1:18 PM

## 2017-12-09 NOTE — Progress Notes (Signed)
   [place] OFFICE PROGRESS NOTE   Diagnosis: Colon cancer  INTERVAL HISTORY:   Ms. Henigan returns for a scheduled visit.  She feels well.  Good appetite and energy level.  No difficulty with bowel function.  She has not taken blood pressure medication for the past 2 days.  Objective:  Vital signs in last 24 hours:  Blood pressure (!) 153/103, pulse 77, temperature 98.2 F (36.8 C), temperature source Oral, resp. rate 17, height _0  (1.651 m), weight 220 lb 6.4 oz (100 kg), SpO2 98 %.    HEENT: Neck without mass Lymphatics: No cervical, supraclavicular, axillary, or inguinal nodes Resp: Lungs clear bilaterally Cardio: Regular rate and rhythm GI: No mass, nontender, no hepatosplenomegaly Vascular: No leg edema  Skin: Scattered psoriatic plaques over the trunk   Lab Results:    Lab Results  Component Value Date   CEA1 1.03 06/10/2017    Medications: I have reviewed the patient's current medications.   Assessment/Plan: 1. Adenocarcinoma of the rectosigmoid colon ? Colonoscopy 10/11/2016-mass at 20 cm, biopsy confirmed invasive adenocarcinoma ? CTs chest, abdomen, and pelvis 10/18/2016-indeterminate right liver lesion-probable hemangioma, focal thickening at the rectosigmoid junction, 5 mm sub-solid left lower lobe nodule ? Robotic low anterior resection 11/27/2016-stage II (T3 N0) well-differentiated adenocarcinoma of the proximal rectum (above peritoneal reflection), grade 1, no lymphovascular or perineural invasion, no loss of mismatch repair protein expression, MSI-stable  2. Multiple colon polyps on the colonoscopy 10/11/2016-sessile ulcerated polyp and tubular adenoma  3. Psoriasis    Disposition: Ms. Sol is in clinical remission from colorectal cancer.  We will follow-up on the CEA from today.  She will return for an office visit and CEA in 6 months.  She plans to schedule a surveillance colonoscopy for January 2020.  15 minutes were  spent with the patient today.  The majority of the time was used for counseling and coordination of care.  Betsy Coder, MD  12/09/2017  12:36 PM

## 2017-12-10 ENCOUNTER — Other Ambulatory Visit: Payer: BLUE CROSS/BLUE SHIELD

## 2017-12-10 ENCOUNTER — Ambulatory Visit: Payer: BLUE CROSS/BLUE SHIELD | Admitting: *Deleted

## 2018-03-16 DIAGNOSIS — Z980 Intestinal bypass and anastomosis status: Secondary | ICD-10-CM | POA: Diagnosis not present

## 2018-03-16 DIAGNOSIS — Z85038 Personal history of other malignant neoplasm of large intestine: Secondary | ICD-10-CM | POA: Diagnosis not present

## 2018-03-16 DIAGNOSIS — K635 Polyp of colon: Secondary | ICD-10-CM | POA: Diagnosis not present

## 2018-03-18 DIAGNOSIS — K635 Polyp of colon: Secondary | ICD-10-CM | POA: Diagnosis not present

## 2018-04-13 IMAGING — MR MR ABDOMEN WO/W CM
11 of 17 series · 28 of 48 positions shown · IV contrast (multihance)
Comparison: None.

CLINICAL DATA: Evaluate liver lesion.

EXAM:
MRI ABDOMEN WITHOUT AND WITH CONTRAST
TECHNIQUE: Multiplanar multisequence MR imaging of the abdomen was performed
both before and after the administration of intravenous contrast.
CONTRAST:  20mL MULTIHANCE GADOBENATE DIMEGLUMINE 529 MG/ML IV SOLN

[Series 3: cor haste · coronal · 5.0mm · 0.74mm/px · 2 of 36 slices shown]
[im 1/36]
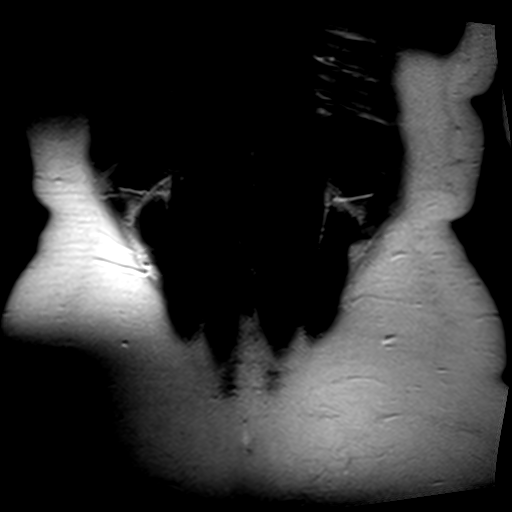
[im 36/36]
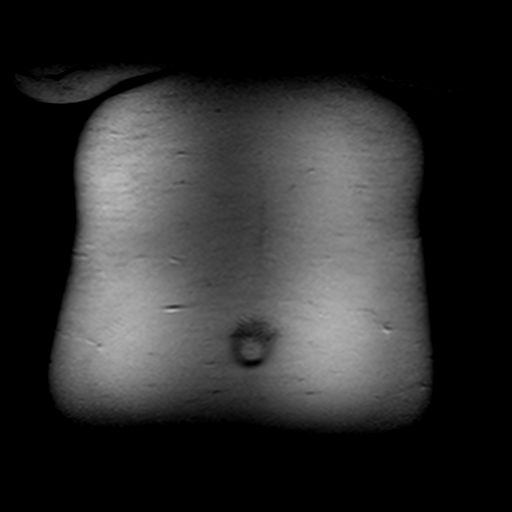

[Series 4: T1 · axial · 6.0mm · 0.74mm/px · z∈[-71,+160]mm · 4 of 72 slices shown]
[im 1/72]
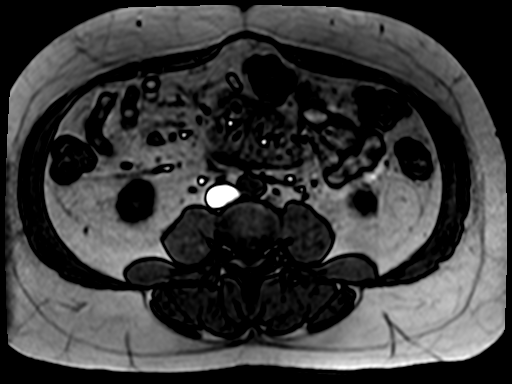
[im 24/72]
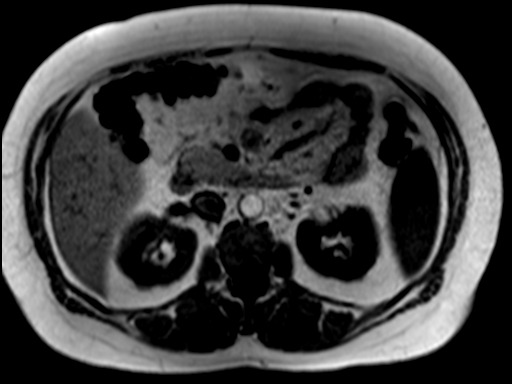
[im 48/72]
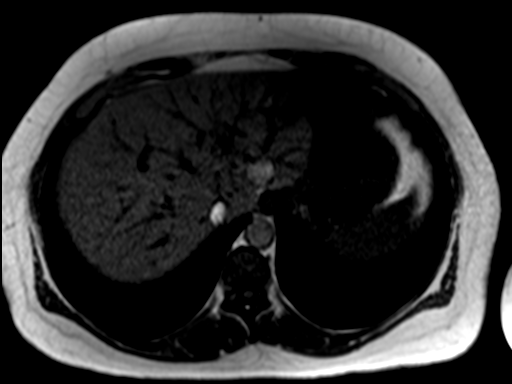
[im 72/72]
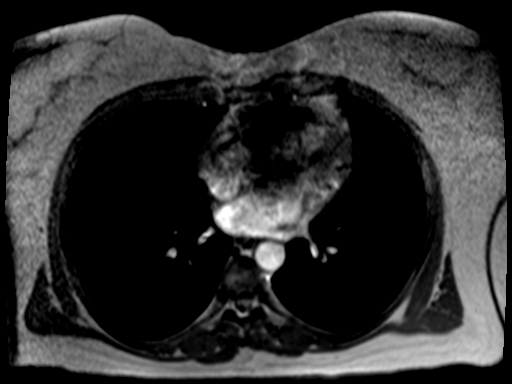

[Series 5: axial haste · axial · 6.0mm · 0.74mm/px · z∈[-87,+157]mm · 3 of 38 slices shown]
[im 1/38]
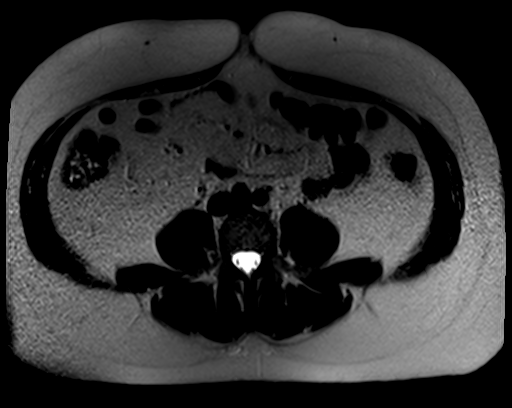
[im 19/38]
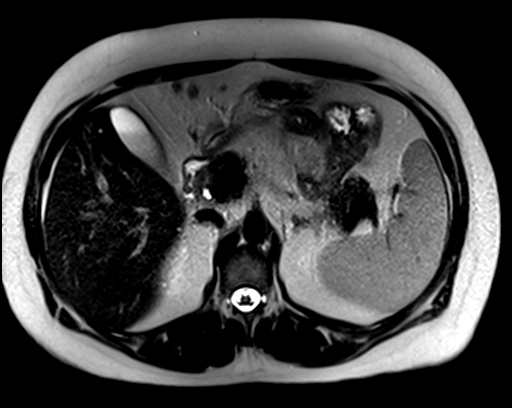
[im 38/38]
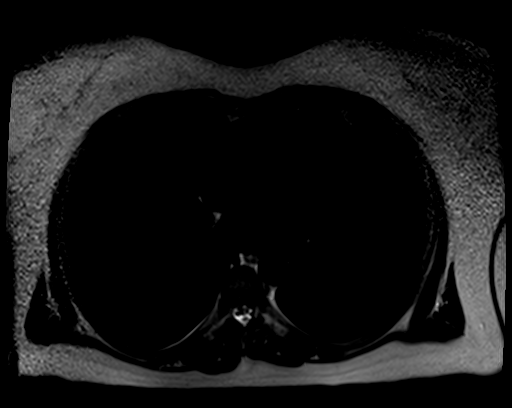

[Series 6: ep2d_diff_b50_500_800_p2_trig · axial · 6.0mm · 1.98mm/px · z∈[-25,+183]mm · 5 of 90 slices shown]
[im 1/90]
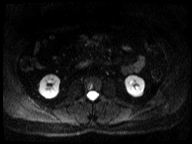
[im 23/90]
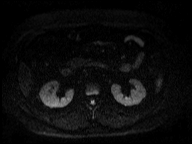
[im 45/90]
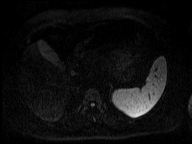
[im 67/90]
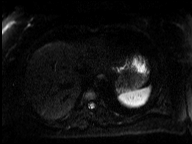
[im 90/90]
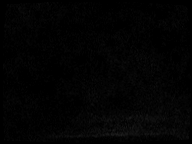

[Series 7: ep2d_[id number]_500_800_p2_trig_adc · axial · 6.0mm · 1.98mm/px · 1 of 30 slices shown]
[im 1/30]
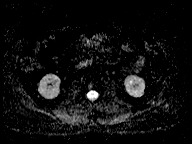

[Series 8: T2 · axial · 6.0mm · 1.12mm/px · 1 of 34 slices shown]
[im 1/34]
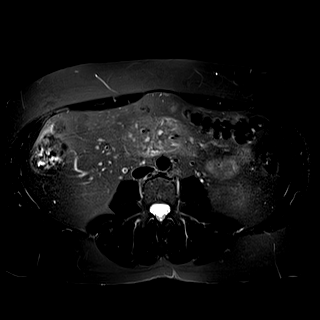

[Series 9: bSSFP · axial · 5.0mm · 0.74mm/px · z∈[-59,+163]mm · 2 of 38 slices shown]
[im 1/38]
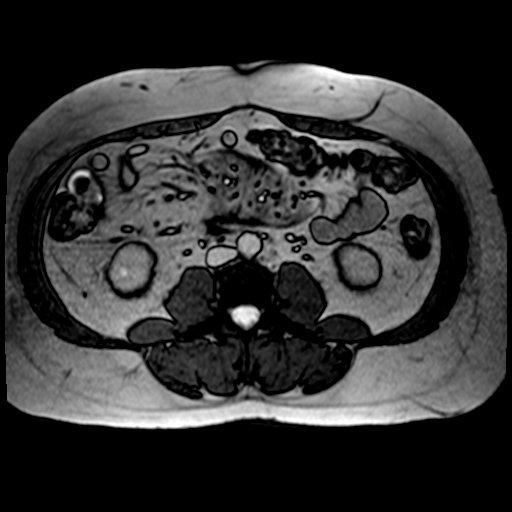
[im 38/38]
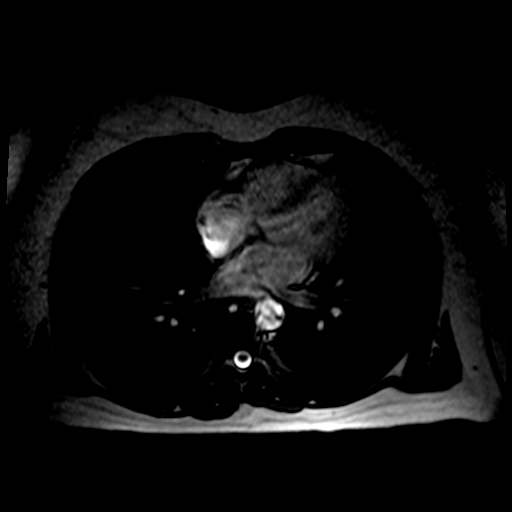

[Series 10: T1 dynamic · axial · non-contrast · 2.5mm · 0.74mm/px · z∈[-57,+160]mm · 3 of 88 slices shown]
[im 1/88]
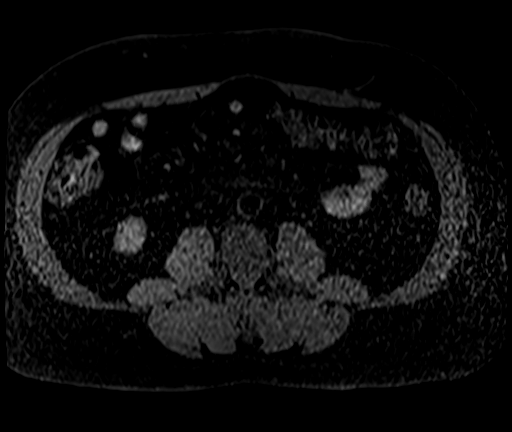
[im 44/88]
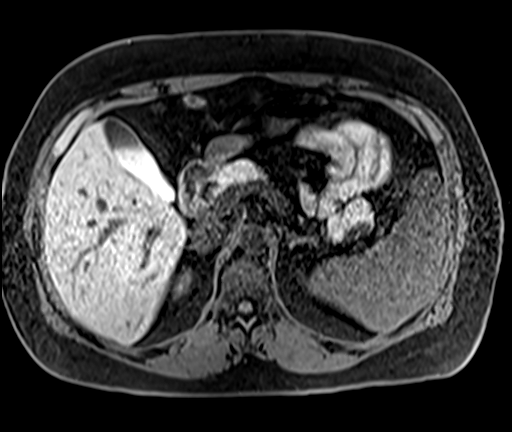
[im 88/88]
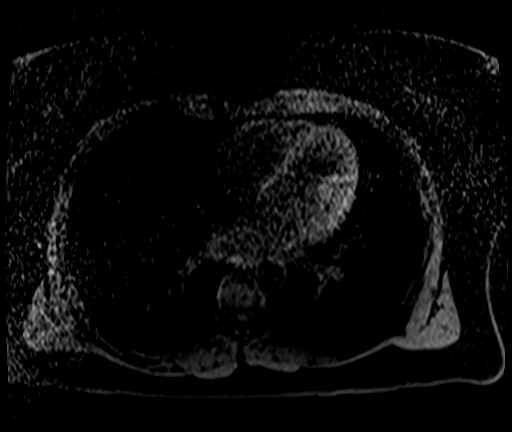

[Series 11: T1 dynamic post-contrast · axial · 2.5mm · 0.74mm/px · z∈[-57,+160]mm · 3 of 88 slices shown (1 of 3)]
[im 1/88]
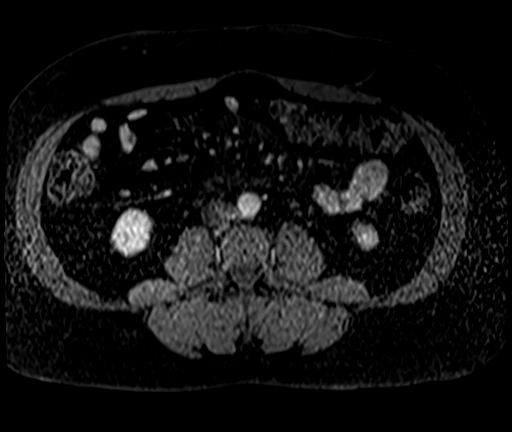
[im 44/88]
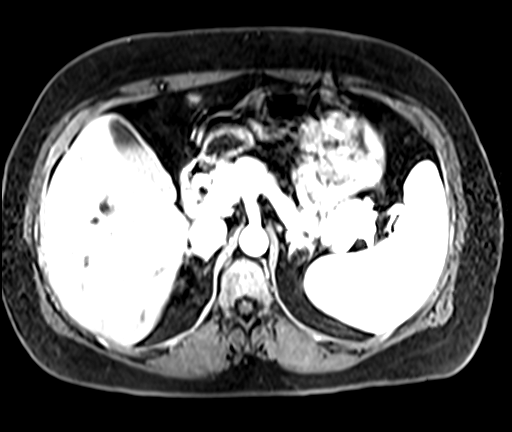
[im 88/88]
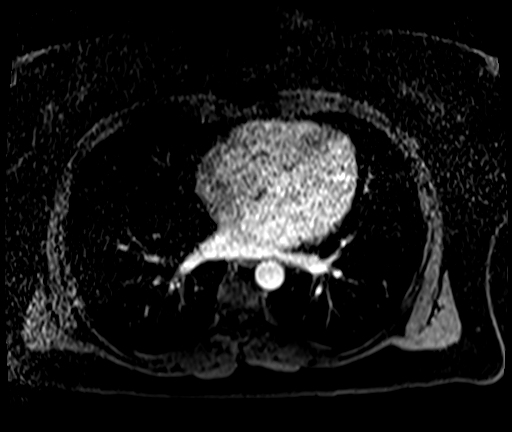

[Series 12: T1 dynamic post-contrast · axial · 2.5mm · 0.74mm/px · z∈[-57,+160]mm · 3 of 88 slices shown (2 of 3)]
[im 1/88]
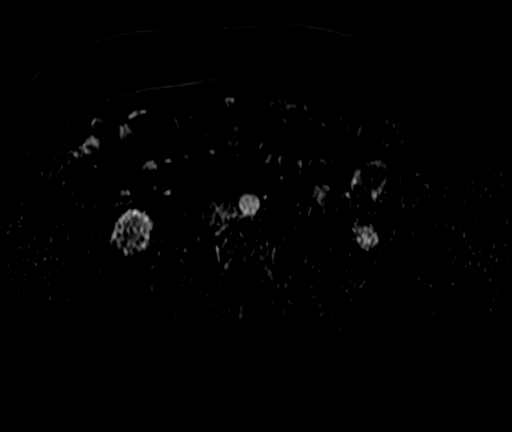
[im 44/88]
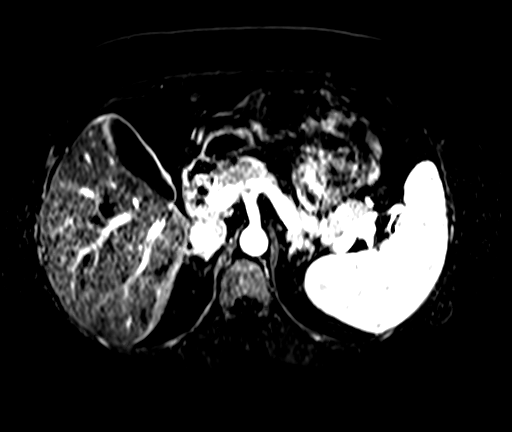
[im 88/88]
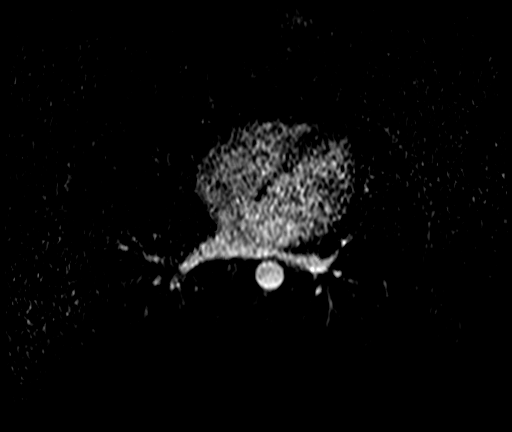

[Series 13: T1 dynamic post-contrast · axial · 2.5mm · 0.74mm/px · 1 of 88 slices shown (3 of 3)]
[im 1/88]
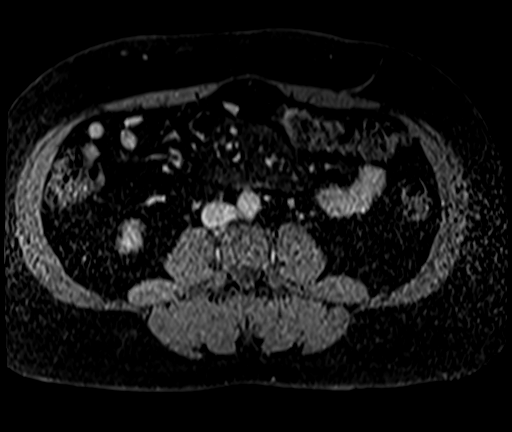

[28 of 48 positions shown; findings below may reference images not displayed]

FINDINGS: Lower chest: No acute findings.

Hepatobiliary: Within the posterior right lobe of liver there is a
T2 hyperintense structure measuring 1.3 cm. This has signal and
enhancement characteristics compatible with a benign hemangioma.

Pancreas: No mass, inflammatory changes, or other parenchymal
abnormality identified.

Spleen:  Normal appearance of the spleen.

Adrenals/Urinary Tract: The adrenal glands are normal. Small
bilateral kidney cysts are identified

Stomach/Bowel: The stomach is normal. The small bowel loops have a
knee scratch set the visualized small and large bowel loops are
unremarkable.

Vascular/Lymphatic: No pathologically enlarged lymph nodes
identified. No abdominal aortic aneurysm demonstrated.

Other:  None.

Musculoskeletal: No suspicious bone lesions identified.
IMPRESSION: 1. The enhancing lesion in the right lobe of liver has signal and
enhancement characteristics of a benign hemangioma. No suspicious
liver lesions identified at this time.
2. Kidney cysts.

## 2018-06-12 ENCOUNTER — Inpatient Hospital Stay: Payer: BLUE CROSS/BLUE SHIELD | Admitting: Oncology

## 2018-06-12 ENCOUNTER — Inpatient Hospital Stay: Payer: BLUE CROSS/BLUE SHIELD

## 2018-06-12 ENCOUNTER — Telehealth: Payer: Self-pay | Admitting: Oncology

## 2018-06-12 NOTE — Telephone Encounter (Signed)
Rescheduled appt from 4/24 per sch msg. Called patient. No answer. Left msg with new appt details

## 2018-07-01 DIAGNOSIS — R319 Hematuria, unspecified: Secondary | ICD-10-CM | POA: Diagnosis not present

## 2018-07-01 DIAGNOSIS — I10 Essential (primary) hypertension: Secondary | ICD-10-CM | POA: Diagnosis not present

## 2018-07-17 ENCOUNTER — Encounter: Payer: Self-pay | Admitting: Nurse Practitioner

## 2018-07-17 ENCOUNTER — Other Ambulatory Visit: Payer: Self-pay

## 2018-07-17 ENCOUNTER — Telehealth: Payer: Self-pay | Admitting: Nurse Practitioner

## 2018-07-17 ENCOUNTER — Telehealth: Payer: Self-pay

## 2018-07-17 ENCOUNTER — Inpatient Hospital Stay: Payer: BLUE CROSS/BLUE SHIELD

## 2018-07-17 ENCOUNTER — Inpatient Hospital Stay: Payer: BLUE CROSS/BLUE SHIELD | Attending: Nurse Practitioner | Admitting: Nurse Practitioner

## 2018-07-17 VITALS — BP 128/88 | HR 73 | Temp 98.0°F | Resp 18 | Ht 65.0 in | Wt 225.6 lb

## 2018-07-17 DIAGNOSIS — C19 Malignant neoplasm of rectosigmoid junction: Secondary | ICD-10-CM | POA: Diagnosis not present

## 2018-07-17 DIAGNOSIS — L409 Psoriasis, unspecified: Secondary | ICD-10-CM

## 2018-07-17 DIAGNOSIS — Z86010 Personal history of colonic polyps: Secondary | ICD-10-CM | POA: Insufficient documentation

## 2018-07-17 DIAGNOSIS — C20 Malignant neoplasm of rectum: Secondary | ICD-10-CM

## 2018-07-17 LAB — CEA (IN HOUSE-CHCC): CEA (CHCC-In House): <1 ng/mL (ref 0.00–5.00)

## 2018-07-17 NOTE — Progress Notes (Addendum)
  [place] OFFICE PROGRESS NOTE   Diagnosis: Colon cancer  INTERVAL HISTORY:   Kathryn Dean returns for follow-up.  She reports undergoing a colonoscopy in January of this year.  Next colonoscopy at a 3-year interval.  Bowels moving regularly.  No bloody or black stools.  No abdominal pain.  No fever, cough, shortness of breath.  She reports continued pain at the right upper outer breast/chest wall.  She describes the pain as "throbbing".  The pain has been occurring for many months. She is not aware of any injury.  She has very mild discomfort same position on the left side.  She reports psoriasis is progressing.  She is interested in beginning methotrexate.  Objective:  Vital signs in last 24 hours:  Blood pressure 128/88, pulse 73, temperature 98 F (36.7 C), temperature source Oral, resp. rate 18, height _0  (1.651 m), weight 225 lb 9.6 oz (102.3 kg), SpO2 98 %.    Lymphatics: No palpable cervical, supraclavicular, axillary or inguinal lymph nodes. Vascular: No leg edema. Musculoskeletal: Tenderness at the right lateral chest wall/edge of the upper outer quadrant right breast.  No mass palpated in this area. Skin: Scattered psoriatic plaques over the trunk.   Lab Results:  Lab Results  Component Value Date   WBC 12.5 (H) 11/28/2016   HGB 11.1 (L) 11/28/2016   HCT 34.5 (L) 11/28/2016   MCV 89.4 11/28/2016   PLT 239 11/28/2016   NEUTROABS 3.2 04/29/2014    Imaging:  No results found.  Medications: I have reviewed the patient's current medications.  Assessment/Plan: 1. Adenocarcinoma of the rectosigmoid colon ? Colonoscopy 10/11/2016-mass at 20 cm, biopsy confirmed invasive adenocarcinoma ? CTs chest, abdomen, and pelvis 10/18/2016-indeterminate right liver lesion-probable hemangioma, focal thickening at the rectosigmoid junction, 5 mm sub-solid left lower lobe nodule ? Robotic low anterior resection 11/27/2016-stage II (T3 N0) well-differentiated  adenocarcinoma of the proximal rectum (above peritoneal reflection), grade 1, no lymphovascular or perineural invasion, no loss of mismatch repair protein expression, MSI-stable  2. Multiple colon polyps on the colonoscopy 10/11/2016-sessile ulcerated polyp and tubular adenoma  3. Psoriasis  Disposition: Kathryn Dean remains in clinical remission from stage II colon cancer.  We will follow-up on the CEA from today.  She is interested in beginning methotrexate for psoriasis.  We encouraged her to discuss this with her dermatologist.  Dr. Benay Spice does not feel there is a contraindication to methotrexate with regard to the colon cancer.  She is having pain/tenderness at the right lateral chest wall/edge of the upper outer quadrant of the breast.  We are referring her for a diagnostic mammogram.  She will return for a CEA and follow-up visit in 6 months.  She will contact the office in the interim with any problems.  Patient seen with Dr. Benay Spice.    Ned Card ANP/GNP-BC   07/17/2018  9:42 AM  This was a shared visit with Ned Card.  Kathryn Dean is a progression from colon cancer.  She will see dermatology to begin treatment for psoriasis. Julieanne Manson, MD

## 2018-07-17 NOTE — Telephone Encounter (Signed)
Scheduled appt per 5/29 los. °

## 2018-07-17 NOTE — Telephone Encounter (Signed)
LVM for patient informing that CEA remains in normal range and keep follow up as scheduled.  Number to center left on voice mail for patient to call back if she has questions.

## 2018-07-17 NOTE — Telephone Encounter (Signed)
-----   Message from Owens Shark, NP sent at 07/17/2018 11:16 AM EDT ----- Please let her know CEA remains in normal range.  Follow-up as scheduled.

## 2018-08-28 DIAGNOSIS — Z1231 Encounter for screening mammogram for malignant neoplasm of breast: Secondary | ICD-10-CM | POA: Diagnosis not present

## 2018-08-28 DIAGNOSIS — Z6838 Body mass index (BMI) 38.0-38.9, adult: Secondary | ICD-10-CM | POA: Diagnosis not present

## 2018-08-28 DIAGNOSIS — Z01419 Encounter for gynecological examination (general) (routine) without abnormal findings: Secondary | ICD-10-CM | POA: Diagnosis not present

## 2018-10-15 DIAGNOSIS — R310 Gross hematuria: Secondary | ICD-10-CM | POA: Diagnosis not present

## 2018-10-22 DIAGNOSIS — N132 Hydronephrosis with renal and ureteral calculous obstruction: Secondary | ICD-10-CM | POA: Diagnosis not present

## 2018-10-22 DIAGNOSIS — R310 Gross hematuria: Secondary | ICD-10-CM | POA: Diagnosis not present

## 2018-11-05 ENCOUNTER — Other Ambulatory Visit: Payer: Self-pay | Admitting: Urology

## 2018-11-05 DIAGNOSIS — N201 Calculus of ureter: Secondary | ICD-10-CM | POA: Diagnosis not present

## 2018-11-05 DIAGNOSIS — R310 Gross hematuria: Secondary | ICD-10-CM | POA: Diagnosis not present

## 2018-11-09 ENCOUNTER — Encounter (HOSPITAL_COMMUNITY): Payer: Self-pay | Admitting: General Practice

## 2018-11-10 NOTE — H&P (Signed)
CC: Gross hematuria  HPI:  10/15/2018  48 year old female experienced an episode of gross hematuria 1 month ago. She has persistent microscopic hematuria today. She has a history of colon cancer status post resection with no evidence of recurrence. She never had radiation. She has a history nephrolithiasis. She has not had a stone for at least 5 years. She denies any pain or discomfort. No dysuria.   11/05/2018  Patient underwent a CT IVP. This revealed a 9 mm proximal left ureteral calculus. There was mild proximal hydronephrosis. She also had multiple small 2-4 mm nonobstructing calculi. She has minimal complaints today. She presents for cystoscopy.     ALLERGIES: Penicillin    MEDICATIONS: Lisinopril     GU PSH: Locm 300-399Mg /Ml Iodine,1Ml - 10/22/2018       PSH Notes: Colon surgery   NON-GU PSH: Appendectomy     GU PMH: Gross hematuria - 10/15/2018 History of urolithiasis - 10/15/2018 Renal calculus    NON-GU PMH: Colon Cancer, History Hypertension    FAMILY HISTORY: 1 son - Other 3 daughters - Other Kidney Failure - Mother, Aunt Kidney Stones - Father Lung Cancer - Mother testicular cancer - Father   SOCIAL HISTORY: Marital Status: Married Preferred Language: English; Race: White Current Smoking Status: Patient has never smoked.   Tobacco Use Assessment Completed: Used Tobacco in last 30 days? Drinks 2 caffeinated drinks per day.    REVIEW OF SYSTEMS:    GU Review Female:   Patient denies frequent urination, hard to postpone urination, burning /pain with urination, get up at night to urinate, leakage of urine, stream starts and stops, trouble starting your stream, have to strain to urinate, and being pregnant.  Gastrointestinal (Upper):   Patient denies nausea, vomiting, and indigestion/ heartburn.  Gastrointestinal (Lower):   Patient denies diarrhea and constipation.  Constitutional:   Patient denies fever, night sweats, weight loss, and fatigue.  Skin:    Patient denies skin rash/ lesion and itching.  Eyes:   Patient denies blurred vision and double vision.  Ears/ Nose/ Throat:   Patient denies sore throat and sinus problems.  Hematologic/Lymphatic:   Patient denies swollen glands and easy bruising.  Cardiovascular:   Patient denies leg swelling and chest pains.  Respiratory:   Patient denies cough and shortness of breath.  Endocrine:   Patient denies excessive thirst.  Musculoskeletal:   Patient denies back pain and joint pain.  Neurological:   Patient denies headaches and dizziness.  Psychologic:   Patient denies depression and anxiety.   VITAL SIGNS:      11/05/2018 08:20 AM  Temperature 97.8 F / 36.5 C   MULTI-SYSTEM PHYSICAL EXAMINATION:    Constitutional: Well-nourished. No physical deformities. Normally developed. Good grooming.  Respiratory: No labored breathing, no use of accessory muscles.   Cardiovascular: Normal temperature, adequate perfusion of extremities  Skin: No paleness, no jaundice  Neurologic / Psychiatric: Oriented to time, oriented to place, oriented to person. No depression, no anxiety, no agitation.  Gastrointestinal: No mass, no tenderness, no rigidity, mildly obese abdomen.   Eyes: Normal conjunctivae. Normal eyelids.  Musculoskeletal: Normal gait and station of head and neck.     PAST DATA REVIEWED:  Source Of History:  Patient  X-Ray Review: C.T. Abdomen/Pelvis: Reviewed Films. Reviewed Report. Discussed With Patient.     PROCEDURES:         Flexible Cystoscopy - 52000  Risks, benefits, and some of the potential complications of the procedure were discussed at length with the patient  including infection, bleeding, voiding discomfort, urinary retention, fever, chills, sepsis, and others. All questions were answered. Informed consent was obtained. Antibiotic prophylaxis was given. Sterile technique and intraurethral analgesia were used.  Meatus:  Normal size. Normal location. Normal condition.  Urethra:   Normal urethra  Ureteral Orifices:  Normal location. Normal size. Normal shape.   Bladder:  No trabeculation. No tumors. Normal mucosa. No stones.      The lower urinary tract was carefully examined. The procedure was well-tolerated and without complications. Antibiotic instructions were given. Instructions were given to call the office immediately for bloody urine, difficulty urinating, urinary retention, painful or frequent urination, fever, chills, nausea, vomiting or other illness. The patient stated that she understood these instructions and would comply with them.   ASSESSMENT:      ICD-10 Details  1 GU:   Gross hematuria - R31.0   2   Ureteral calculus - N20.1    PLAN:           Document Letter(s):  Created for Patient: Clinical Summary         Notes:   We discussed the management of urinary stones. These options include observation, ureteroscopy, and shockwave lithotripsy. We discussed which options are relevant to these particular stones. We discussed the natural history of stones as well as the complications of untreated stones and the impact on quality of life without treatment as well as with each of the above listed treatments. We also discussed the efficacy of each treatment in its ability to clear the stone burden. With any of these management options I discussed the signs and symptoms of infection and the need for emergent treatment should these be experienced. For each option we discussed the ability of each procedure to clear the patient of their stone burden.   For observation I described the risks which include but are not limited to silent renal damage, life-threatening infection, need for emergent surgery, failure to pass stone, and pain.   For ureteroscopy I described the risks which include heart attack, stroke, pulmonary embolus, death, bleeding, infection, damage to contiguous structures, positioning injury, ureteral stricture, ureteral avulsion, ureteral injury, need  for ureteral stent, inability to perform ureteroscopy, need for an interval procedure, inability to clear stone burden, stent discomfort and pain.   For shockwave lithotripsy I described the risks which include arrhythmia, kidney contusion, kidney hemorrhage, need for transfusion, pain, inability to break up stone, inability to pass stone fragments, Steinstrasse, infection associated with obstructing stones, need for different surgical procedure, need for repeat shockwave lithotripsy.   She would like to proceed with left ESWL.   After a thorough review of the management options for the patient's condition the patient  elected to proceed with surgical therapy as noted above. We have discussed the potential benefits and risks of the procedure, side effects of the proposed treatment, the likelihood of the patient achieving the goals of the procedure, and any potential problems that might occur during the procedure or recuperation. Informed consent has been obtained.

## 2018-11-12 ENCOUNTER — Ambulatory Visit (HOSPITAL_COMMUNITY)
Admission: RE | Admit: 2018-11-12 | Discharge: 2018-11-12 | Disposition: A | Discharge status: Home / Self Care | Payer: BC Managed Care – PPO | Source: Other Acute Inpatient Hospital | Attending: Urology | Admitting: Urology

## 2018-11-12 ENCOUNTER — Encounter (HOSPITAL_COMMUNITY)
Admission: RE | Disposition: A | Discharge status: Home / Self Care | Payer: Self-pay | Source: Other Acute Inpatient Hospital | Attending: Urology

## 2018-11-12 ENCOUNTER — Other Ambulatory Visit: Payer: Self-pay

## 2018-11-12 ENCOUNTER — Ambulatory Visit (HOSPITAL_COMMUNITY): Payer: BC Managed Care – PPO

## 2018-11-12 ENCOUNTER — Encounter (HOSPITAL_COMMUNITY): Payer: Self-pay | Admitting: *Deleted

## 2018-11-12 DIAGNOSIS — N132 Hydronephrosis with renal and ureteral calculous obstruction: Secondary | ICD-10-CM | POA: Insufficient documentation

## 2018-11-12 DIAGNOSIS — Z87442 Personal history of urinary calculi: Secondary | ICD-10-CM | POA: Diagnosis not present

## 2018-11-12 DIAGNOSIS — Z9049 Acquired absence of other specified parts of digestive tract: Secondary | ICD-10-CM | POA: Insufficient documentation

## 2018-11-12 DIAGNOSIS — N201 Calculus of ureter: Secondary | ICD-10-CM

## 2018-11-12 DIAGNOSIS — Z79899 Other long term (current) drug therapy: Secondary | ICD-10-CM | POA: Insufficient documentation

## 2018-11-12 DIAGNOSIS — Z85038 Personal history of other malignant neoplasm of large intestine: Secondary | ICD-10-CM | POA: Diagnosis not present

## 2018-11-12 DIAGNOSIS — Z880 Allergy status to penicillin: Secondary | ICD-10-CM | POA: Diagnosis not present

## 2018-11-12 DIAGNOSIS — N200 Calculus of kidney: Secondary | ICD-10-CM | POA: Diagnosis not present

## 2018-11-12 HISTORY — PX: EXTRACORPOREAL SHOCK WAVE LITHOTRIPSY: SHX1557

## 2018-11-12 HISTORY — DX: Personal history of urinary calculi: Z87.442

## 2018-11-12 LAB — PREGNANCY, URINE: Preg Test, Ur: NEGATIVE

## 2018-11-12 SURGERY — LITHOTRIPSY, ESWL
Anesthesia: LOCAL | Laterality: Left

## 2018-11-12 MED ORDER — SODIUM CHLORIDE 0.9 % IV SOLN
INTRAVENOUS | Status: DC
  Administered 2018-11-12: 07:00:00 via INTRAVENOUS

## 2018-11-12 MED ORDER — CIPROFLOXACIN HCL 500 MG PO TABS
500.0000 mg | ORAL_TABLET | ORAL | Status: AC
  Administered 2018-11-12: 07:00:00 500 mg via ORAL
  Filled 2018-11-12: qty 1

## 2018-11-12 MED ORDER — DIAZEPAM 5 MG PO TABS
10.0000 mg | ORAL_TABLET | ORAL | Status: AC
  Administered 2018-11-12: 10 mg via ORAL
  Filled 2018-11-12: qty 2

## 2018-11-12 MED ORDER — DIPHENHYDRAMINE HCL 25 MG PO CAPS
25.0000 mg | ORAL_CAPSULE | ORAL | Status: AC
  Administered 2018-11-12: 25 mg via ORAL
  Filled 2018-11-12: qty 1

## 2018-11-12 NOTE — Discharge Instructions (Signed)
I have reviewed discharge instructions in detail with the patient. They will follow-up with me or their physician as scheduled. My nurse will also be calling the patients as per protocol.   

## 2018-11-12 NOTE — Interval H&P Note (Signed)
History and Physical Interval Note:  11/12/2018 6:43 AM  Kathryn Dean  has presented today for surgery, with the diagnosis of LEFT URETERAL STONE.  The various methods of treatment have been discussed with the patient and family. After consideration of risks, benefits and other options for treatment, the patient has consented to  Procedure(s): EXTRACORPOREAL SHOCK WAVE LITHOTRIPSY (ESWL) (Left) as a surgical intervention.  The patient's history has been reviewed, patient examined, no change in status, stable for surgery.  I have reviewed the patient's chart and labs.  Questions were answered to the patient's satisfaction.     May Manrique A Charmain Diosdado

## 2018-11-13 ENCOUNTER — Encounter (HOSPITAL_COMMUNITY): Payer: Self-pay | Admitting: Urology

## 2018-11-27 DIAGNOSIS — Z0000 Encounter for general adult medical examination without abnormal findings: Secondary | ICD-10-CM | POA: Diagnosis not present

## 2018-11-27 DIAGNOSIS — E782 Mixed hyperlipidemia: Secondary | ICD-10-CM | POA: Diagnosis not present

## 2018-11-27 DIAGNOSIS — Z1389 Encounter for screening for other disorder: Secondary | ICD-10-CM | POA: Diagnosis not present

## 2018-11-27 DIAGNOSIS — N201 Calculus of ureter: Secondary | ICD-10-CM | POA: Diagnosis not present

## 2018-12-01 DIAGNOSIS — L400 Psoriasis vulgaris: Secondary | ICD-10-CM | POA: Diagnosis not present

## 2018-12-08 ENCOUNTER — Telehealth: Payer: Self-pay | Admitting: *Deleted

## 2018-12-08 NOTE — Telephone Encounter (Signed)
EAQ162CD Study- 24 months time point. Called patient to remind her to complete her last study survey for 24 months on this study. Also asked patient to return call to research nurse to answer a few follow up questions for this time point. Thanked patient in advance for her time.  Foye Spurling, BSN, RN Clinical Research Nurse 12/08/2018 2:14 PM

## 2018-12-16 IMAGING — MG DIGITAL SCREENING BILATERAL MAMMOGRAM WITH CAD
4 series · 4 of 4 positions shown · non-contrast
Comparison: Previous exam(s).

CLINICAL DATA: Screening.

EXAM:
DIGITAL SCREENING BILATERAL MAMMOGRAM WITH CAD

[R MLO]
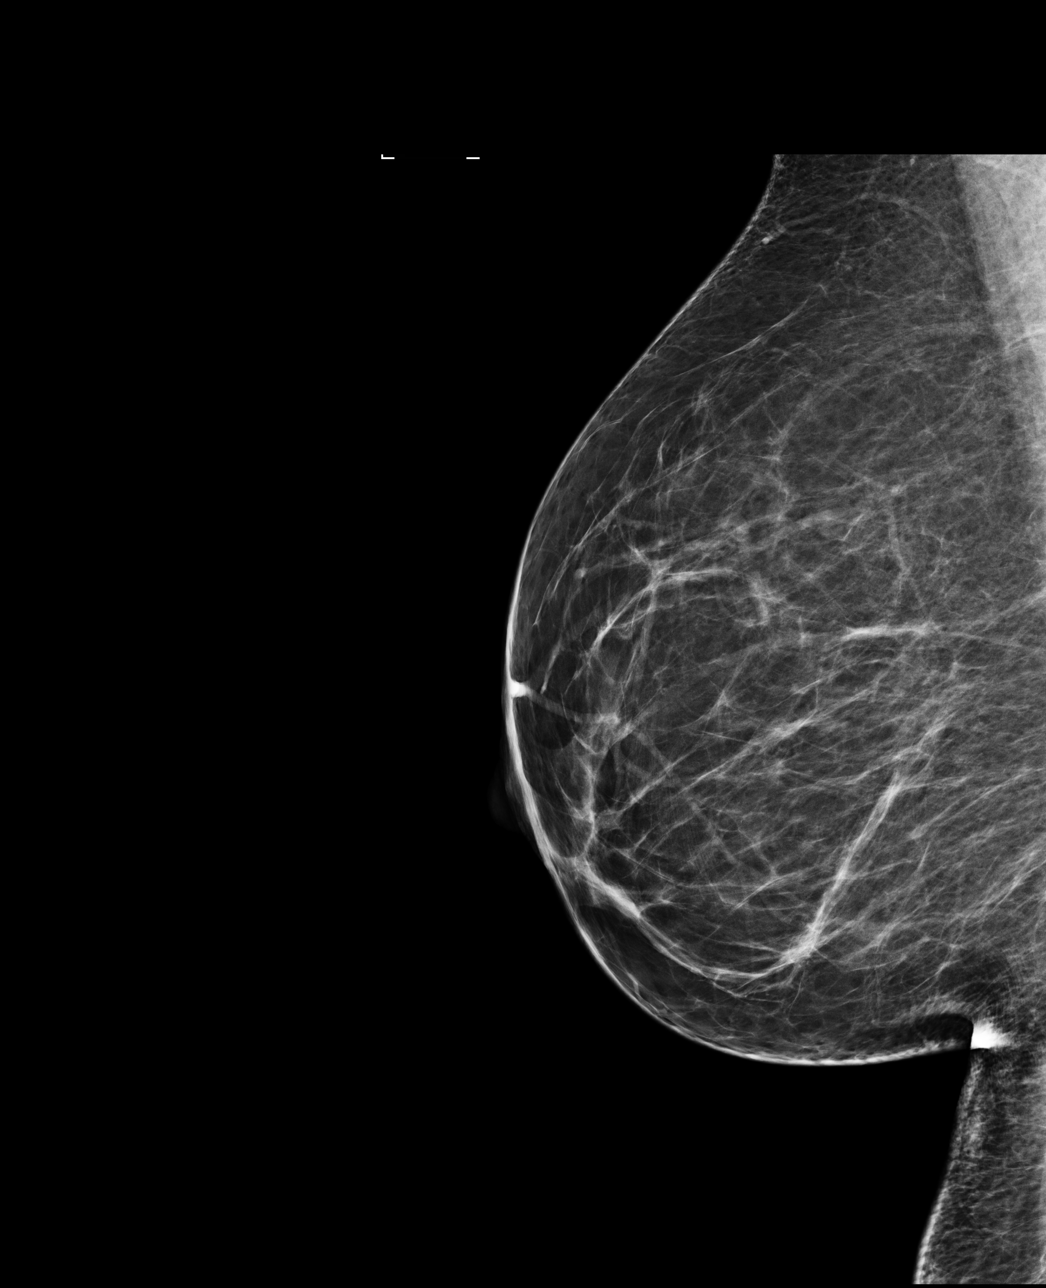

[L MLO]
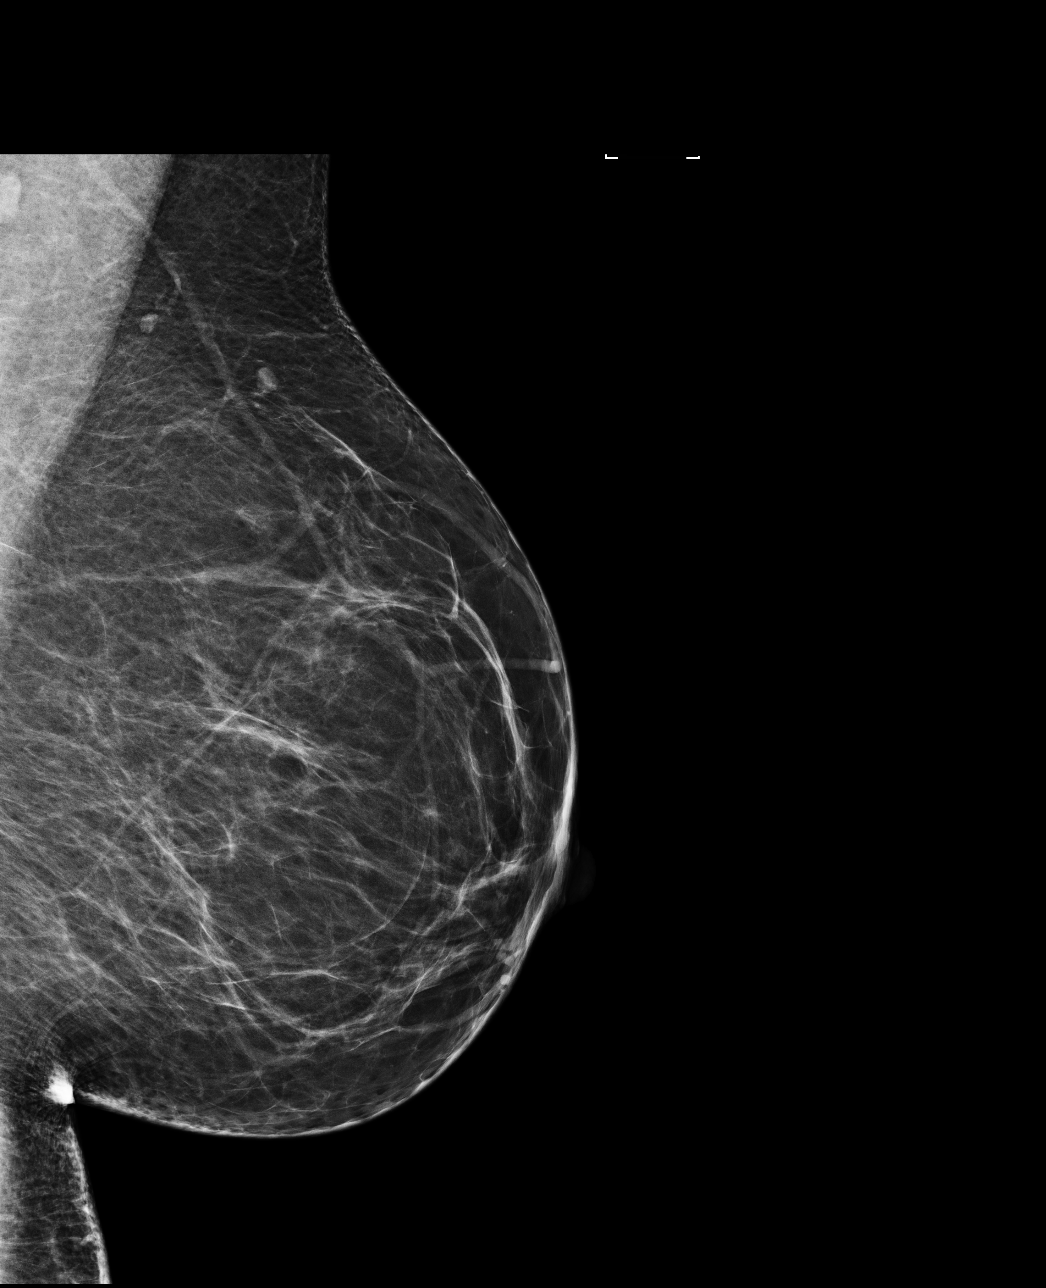

[R CC]
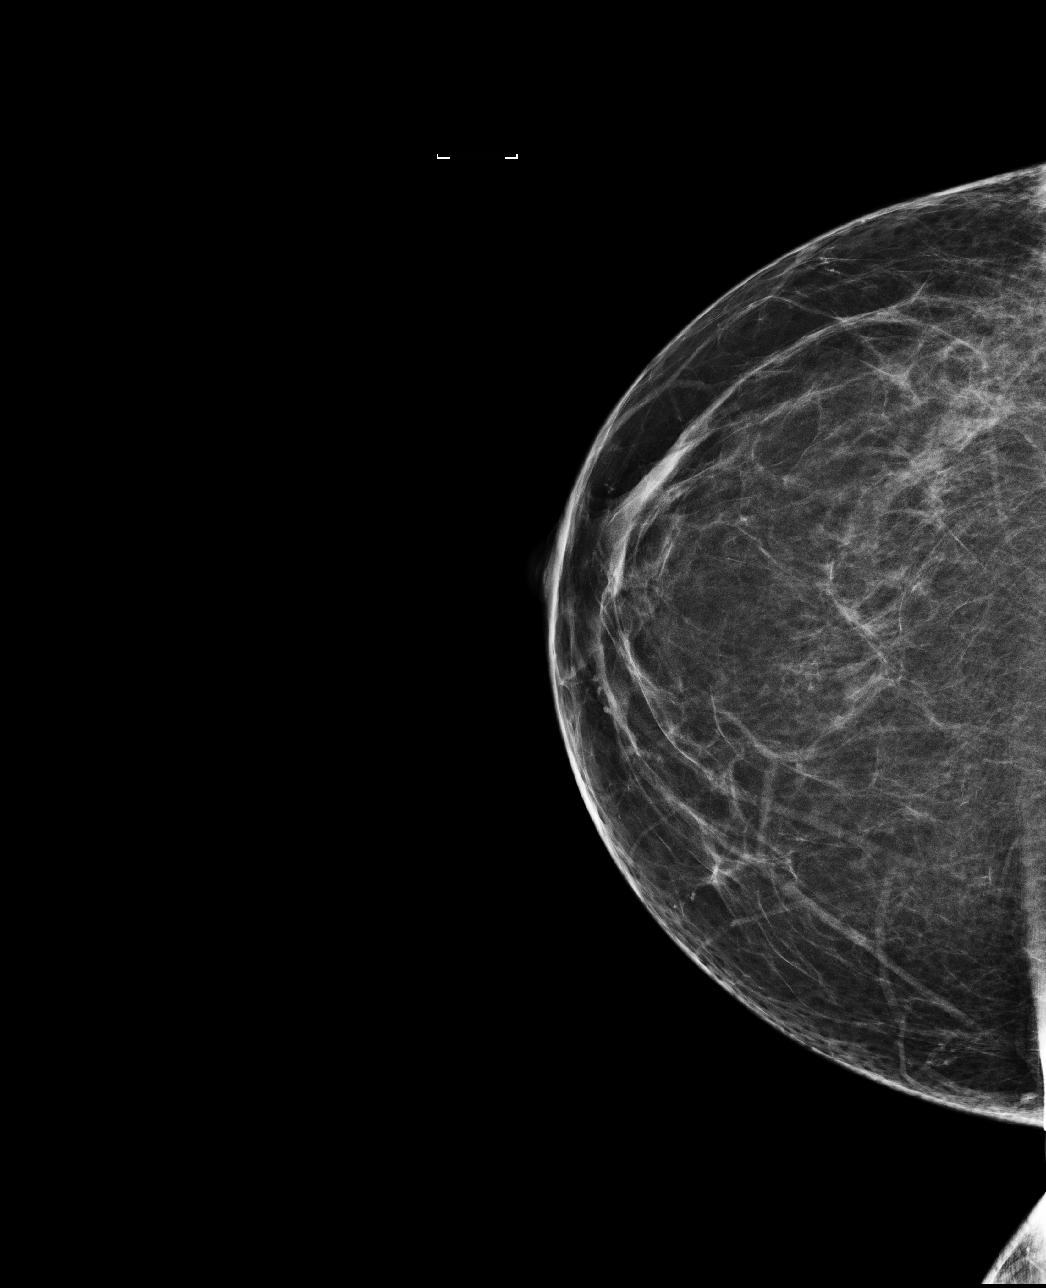

[L CC]
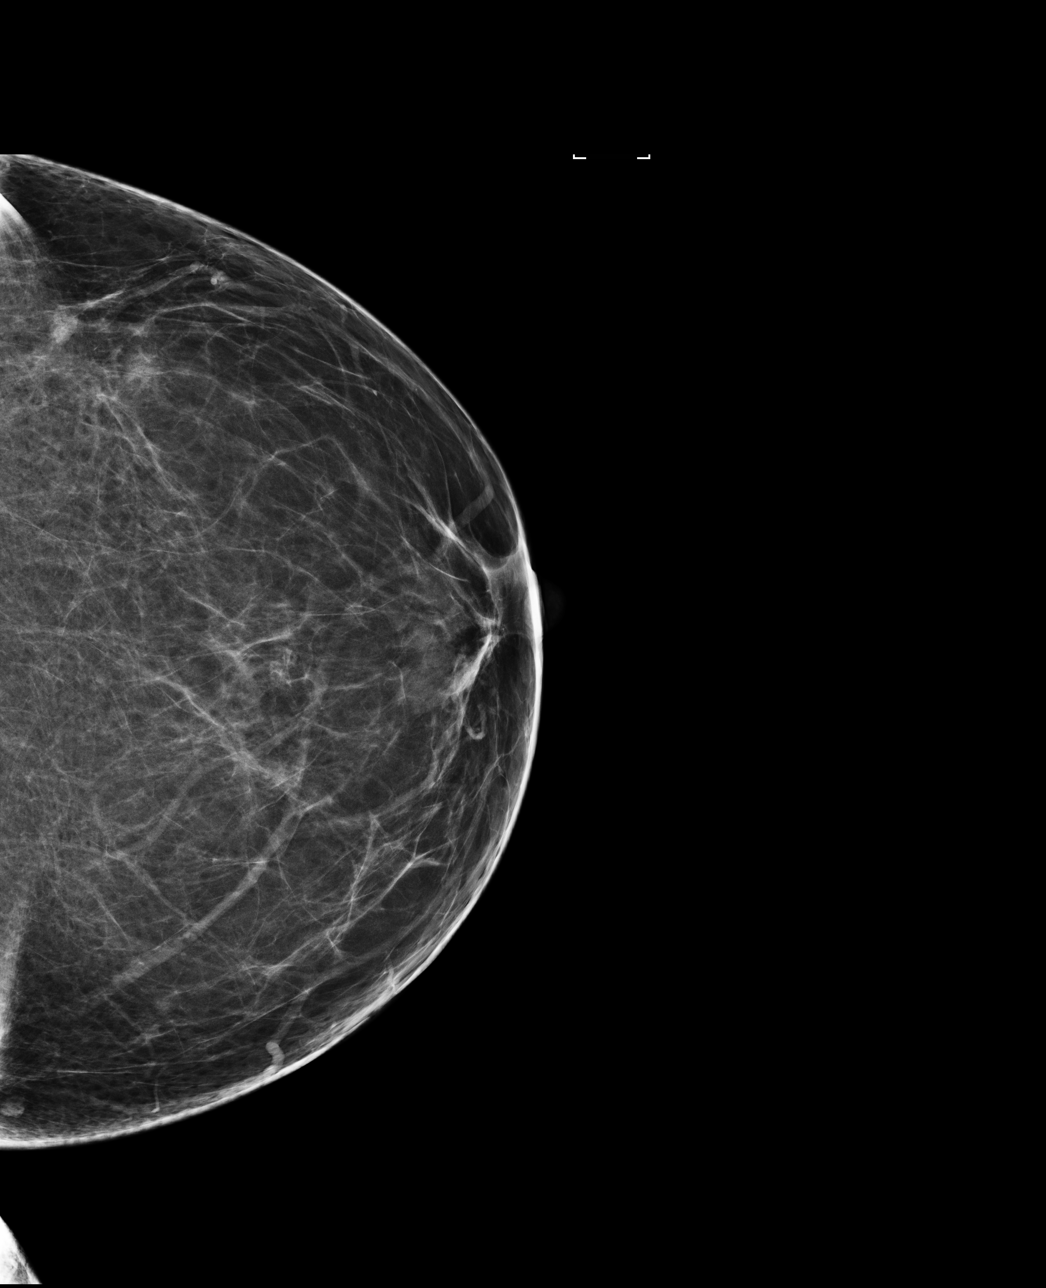

[4 of 4 positions shown; findings below may reference images not displayed]

ACR Breast Density Category b: There are scattered areas of
fibroglandular density.
FINDINGS: There are no findings suspicious for malignancy. Images were
processed with CAD.
IMPRESSION: No mammographic evidence of malignancy. A result letter of this
screening mammogram will be mailed directly to the patient.

RECOMMENDATION:
Screening mammogram in one year. (Code:AS-G-LCT)

BI-RADS CATEGORY  1: Negative.

## 2018-12-30 ENCOUNTER — Telehealth: Payer: Self-pay | Admitting: *Deleted

## 2018-12-30 NOTE — Telephone Encounter (Signed)
EAQ162CD Study; LVM requesting pt call back to complete 24 months time point on this study. Asked patient to complete the study survey which was emailed.  Thanked patient for her participation. This is the last study survey and contact and she will have completed this study.  Foye Spurling, BSN, RN Clinical Research Nurse 12/30/2018 1:19 PM

## 2018-12-30 NOTE — Telephone Encounter (Addendum)
[place] STUDY; Patient returned call to complete the study. She states she has not had any change in her insurance since last visit. She states she is feeling well with no symptoms and able to carry on all her pre-disease activities without restriction (ECOG 0). Colonoscopy done earlier this year did not show any evidence of cancer. No further treatment planned. Asked patient to complete the 24 months survey and patient states she doesn't remember getting one and asked if the study can re-send it to her. Informed patient I will email the study to request they re-send the survey. Thanked patient for her participation in this study. Asked her to call back if any trouble completing the survey or if she doesn't get one. She verbalized understanding.   Foye Spurling, BSN, RN Clinical Research Nurse 12/30/2018 1:35 PM

## 2019-01-12 ENCOUNTER — Telehealth: Payer: Self-pay | Admitting: Oncology

## 2019-01-12 NOTE — Telephone Encounter (Signed)
Called patient per providers request, left a voicemail. °

## 2019-01-18 ENCOUNTER — Telehealth: Payer: Self-pay | Admitting: Oncology

## 2019-01-18 ENCOUNTER — Other Ambulatory Visit: Payer: Self-pay

## 2019-01-18 ENCOUNTER — Inpatient Hospital Stay: Payer: BC Managed Care – PPO | Attending: Oncology

## 2019-01-18 ENCOUNTER — Inpatient Hospital Stay (HOSPITAL_BASED_OUTPATIENT_CLINIC_OR_DEPARTMENT_OTHER): Payer: BC Managed Care – PPO | Admitting: Oncology

## 2019-01-18 DIAGNOSIS — C19 Malignant neoplasm of rectosigmoid junction: Secondary | ICD-10-CM | POA: Diagnosis not present

## 2019-01-18 DIAGNOSIS — Z85048 Personal history of other malignant neoplasm of rectum, rectosigmoid junction, and anus: Secondary | ICD-10-CM | POA: Insufficient documentation

## 2019-01-18 DIAGNOSIS — Z87442 Personal history of urinary calculi: Secondary | ICD-10-CM | POA: Insufficient documentation

## 2019-01-18 DIAGNOSIS — L409 Psoriasis, unspecified: Secondary | ICD-10-CM | POA: Insufficient documentation

## 2019-01-18 DIAGNOSIS — C20 Malignant neoplasm of rectum: Secondary | ICD-10-CM

## 2019-01-18 LAB — CEA (IN HOUSE-CHCC): CEA (CHCC-In House): <1 ng/mL (ref 0.00–5.00)

## 2019-01-18 NOTE — Progress Notes (Signed)
  [facility] OFFICE VISIT PROGRESS NOTE  I connected with Kathryn Dean on 01/18/19 at1PM EST by telephone and verified that I am speaking with the correct person using two identifiers.   I discussed the limitations, risks, security and privacy concerns of performing an evaluation and management service by telemedicine and the availability of in-person appointments. I also discussed with the patient that there may be a patient responsible charge related to this service. The patient expressed understanding and agreed to proceed.   Patient's location: Home Provider's location: Office   Diagnosis: Colon cancer  INTERVAL HISTORY:   Kathryn Dean is seen today for a telehealth visit.  The telehealth visit is secondary to distancing with the Covid pandemic. She reports feeling well.  She noted to "pea-sized "lymph nodes in the left axilla a few weeks ago.  These have resolved.  No palpable change in either breast.  She reports having a mammogram at Dr. Johnnye Lana office in May of this year.  She had recent gross hematuria.  She was evaluated by urology and found to have a kidney stone.  She underwent a cystoscopy and lithotripsy therapy.    Lab Results:  Lab Results  Component Value Date   WBC 12.5 (H) 11/28/2016   HGB 11.1 (L) 11/28/2016   HCT 34.5 (L) 11/28/2016   MCV 89.4 11/28/2016   PLT 239 11/28/2016   NEUTROABS 3.2 04/29/2014    Medications: I have reviewed the patient's current medications.  Assessment/Plan: 1. Adenocarcinoma of the rectosigmoid colon ? Colonoscopy 10/11/2016-mass at 20 cm, biopsy confirmed invasive adenocarcinoma ? CTs chest, abdomen, and pelvis 10/18/2016-indeterminate right liver lesion-probable hemangioma, focal thickening at the rectosigmoid junction, 5 mm sub-solid left lower lobe nodule ? Robotic low anterior resection 11/27/2016-stage II (T3 N0) well-differentiated adenocarcinoma of the proximal rectum  (above peritoneal reflection), grade 1, no lymphovascular or perineural invasion, no loss of mismatch repair protein expression, MSI-stable  2. Multiple colon polyps on the colonoscopy 10/11/2016-sessile ulcerated polyp and tubular adenoma  Colonoscopy 03/16/2018-2 mm polyp removed from the proximal transverse colon  3. Psoriasis 4.   History of kidney stones  Disposition: Kathryn Dean is in clinical remission from colon cancer.  We will follow-up on the CEA from today.  She will return for an office visit and CEA in 6 months.  She will call for recurrent palpable lymphadenopathy.  We will follow-up on the mammogram report from 2020.   I discussed the assessment and treatment plan with the patient. The patient was provided an opportunity to ask questions and all were answered. The patient agreed with the plan and demonstrated an understanding of the instructions.   The patient was advised to call back or seek an in-person evaluation if the symptoms worsen or if the condition fails to improve as anticipated.  I provided 15 minutes of telephone, chart review, and documentation time during this encounter, and > 50% was spent counseling as documented under my assessment & plan.  Betsy Coder ANP/GNP-BC   01/18/2019 1:11 PM

## 2019-01-18 NOTE — Telephone Encounter (Signed)
Patient returned phone call regarding voicemail that was left, patient agreed to do follow-up phone visit on 11/30.

## 2019-01-19 DIAGNOSIS — H40013 Open angle with borderline findings, low risk, bilateral: Secondary | ICD-10-CM | POA: Diagnosis not present

## 2019-01-22 DIAGNOSIS — R1084 Generalized abdominal pain: Secondary | ICD-10-CM | POA: Diagnosis not present

## 2019-01-22 DIAGNOSIS — N200 Calculus of kidney: Secondary | ICD-10-CM | POA: Diagnosis not present

## 2019-02-05 DIAGNOSIS — N200 Calculus of kidney: Secondary | ICD-10-CM | POA: Diagnosis not present

## 2019-03-12 DIAGNOSIS — N200 Calculus of kidney: Secondary | ICD-10-CM | POA: Diagnosis not present

## 2019-04-20 ENCOUNTER — Ambulatory Visit (INDEPENDENT_AMBULATORY_CARE_PROVIDER_SITE_OTHER): Payer: Self-pay | Admitting: Family Medicine

## 2019-04-20 ENCOUNTER — Other Ambulatory Visit: Payer: Self-pay

## 2019-04-20 DIAGNOSIS — D485 Neoplasm of uncertain behavior of skin: Secondary | ICD-10-CM | POA: Diagnosis not present

## 2019-04-20 DIAGNOSIS — L409 Psoriasis, unspecified: Secondary | ICD-10-CM

## 2019-04-20 DIAGNOSIS — L400 Psoriasis vulgaris: Secondary | ICD-10-CM | POA: Diagnosis not present

## 2019-04-20 DIAGNOSIS — C441122 Basal cell carcinoma of skin of right lower eyelid, including canthus: Secondary | ICD-10-CM | POA: Diagnosis not present

## 2019-04-22 LAB — CBC WITH DIFFERENTIAL/PLATELET
Basophils Absolute: 0 10*3/uL (ref 0.0–0.2)
Basos: 0 %
EOS (ABSOLUTE): 0.4 10*3/uL (ref 0.0–0.4)
Eos: 4 %
Hematocrit: 38.9 % (ref 34.0–46.6)
Hemoglobin: 12.8 g/dL (ref 11.1–15.9)
Immature Grans (Abs): 0 10*3/uL (ref 0.0–0.1)
Immature Granulocytes: 0 %
Lymphocytes Absolute: 2 10*3/uL (ref 0.7–3.1)
Lymphs: 22 %
MCH: 29.5 pg (ref 26.6–33.0)
MCHC: 32.9 g/dL (ref 31.5–35.7)
MCV: 90 fL (ref 79–97)
Monocytes Absolute: 0.6 10*3/uL (ref 0.1–0.9)
Monocytes: 6 %
Neutrophils Absolute: 6.1 10*3/uL (ref 1.4–7.0)
Neutrophils: 68 %
Platelets: 244 10*3/uL (ref 150–450)
RBC: 4.34 x10E6/uL (ref 3.77–5.28)
RDW: 12.1 % (ref 11.7–15.4)
WBC: 9.1 10*3/uL (ref 3.4–10.8)

## 2019-04-22 LAB — COMPREHENSIVE METABOLIC PANEL
ALT: 17 IU/L (ref 0–32)
AST: 18 IU/L (ref 0–40)
Albumin/Globulin Ratio: 1.8 (ref 1.2–2.2)
Albumin: 4.6 g/dL (ref 3.8–4.8)
Alkaline Phosphatase: 121 IU/L — ABNORMAL HIGH (ref 39–117)
BUN/Creatinine Ratio: 17 (ref 9–23)
BUN: 15 mg/dL (ref 6–24)
Bilirubin Total: 0.3 mg/dL (ref 0.0–1.2)
CO2: 21 mmol/L (ref 20–29)
Calcium: 9.8 mg/dL (ref 8.7–10.2)
Chloride: 103 mmol/L (ref 96–106)
Creatinine, Ser: 0.88 mg/dL (ref 0.57–1.00)
GFR calc Af Amer: 89 mL/min/{1.73_m2} (ref >59)
GFR calc non Af Amer: 77 mL/min/{1.73_m2} (ref >59)
Globulin, Total: 2.5 g/dL (ref 1.5–4.5)
Glucose: 108 mg/dL — ABNORMAL HIGH (ref 65–99)
Potassium: 4.1 mmol/L (ref 3.5–5.2)
Sodium: 139 mmol/L (ref 134–144)
Total Protein: 7.1 g/dL (ref 6.0–8.5)

## 2019-04-22 LAB — QUANTIFERON-TB GOLD PLUS
QuantiFERON Mitogen Value: >10 IU/mL
QuantiFERON Nil Value: 0.03 IU/mL
QuantiFERON TB1 Ag Value: 0.05 IU/mL
QuantiFERON TB2 Ag Value: 0.05 IU/mL
QuantiFERON-TB Gold Plus: NEGATIVE

## 2019-04-22 LAB — HEPATITIS C ANTIBODY: Hep C Virus Ab: <0.1 s/co ratio (ref 0.0–0.9)

## 2019-04-22 LAB — HEPATITIS B SURFACE ANTIGEN: Hepatitis B Surface Ag: NEGATIVE

## 2019-05-26 DIAGNOSIS — D1801 Hemangioma of skin and subcutaneous tissue: Secondary | ICD-10-CM | POA: Diagnosis not present

## 2019-05-26 DIAGNOSIS — L814 Other melanin hyperpigmentation: Secondary | ICD-10-CM | POA: Diagnosis not present

## 2019-05-26 DIAGNOSIS — L821 Other seborrheic keratosis: Secondary | ICD-10-CM | POA: Diagnosis not present

## 2019-05-26 DIAGNOSIS — D225 Melanocytic nevi of trunk: Secondary | ICD-10-CM | POA: Diagnosis not present

## 2019-05-26 DIAGNOSIS — L570 Actinic keratosis: Secondary | ICD-10-CM | POA: Diagnosis not present

## 2019-06-08 DIAGNOSIS — C44319 Basal cell carcinoma of skin of other parts of face: Secondary | ICD-10-CM | POA: Diagnosis not present

## 2019-07-20 ENCOUNTER — Other Ambulatory Visit: Payer: Self-pay

## 2019-07-20 ENCOUNTER — Inpatient Hospital Stay: Payer: BC Managed Care – PPO | Attending: Oncology

## 2019-07-20 DIAGNOSIS — Z85038 Personal history of other malignant neoplasm of large intestine: Secondary | ICD-10-CM | POA: Diagnosis not present

## 2019-07-20 DIAGNOSIS — C19 Malignant neoplasm of rectosigmoid junction: Secondary | ICD-10-CM

## 2019-07-21 LAB — CEA (IN HOUSE-CHCC): CEA (CHCC-In House): <1 ng/mL (ref 0.00–5.00)

## 2019-07-23 ENCOUNTER — Telehealth: Payer: Self-pay | Admitting: *Deleted

## 2019-07-23 ENCOUNTER — Telehealth: Payer: Self-pay | Admitting: Oncology

## 2019-07-23 NOTE — Telephone Encounter (Signed)
-----   Message from Ladell Pier, MD sent at 07/21/2019 10:31 PM EDT ----- Please call patient, was supposed to have office visit, what happened?

## 2019-07-23 NOTE — Telephone Encounter (Signed)
Left VM that lab is normal and scheduler will be calling for f/u appointment w/MD in next month. Scheduling message sent

## 2019-07-23 NOTE — Telephone Encounter (Signed)
Scheduled per 6/4 sch message. Unable to reach pt. Left voicemail- appts added 7/6

## 2019-08-06 DIAGNOSIS — N200 Calculus of kidney: Secondary | ICD-10-CM | POA: Diagnosis not present

## 2019-08-24 ENCOUNTER — Other Ambulatory Visit: Payer: BC Managed Care – PPO

## 2019-08-24 ENCOUNTER — Ambulatory Visit: Payer: BC Managed Care – PPO | Admitting: Oncology

## 2019-09-03 ENCOUNTER — Other Ambulatory Visit: Payer: Self-pay

## 2019-09-03 ENCOUNTER — Inpatient Hospital Stay: Payer: BC Managed Care – PPO | Attending: Oncology | Admitting: Oncology

## 2019-09-03 ENCOUNTER — Telehealth: Payer: Self-pay | Admitting: Oncology

## 2019-09-03 VITALS — BP 116/82 | HR 101 | Temp 97.3°F | Resp 18 | Ht 64.0 in | Wt 223.5 lb

## 2019-09-03 DIAGNOSIS — R319 Hematuria, unspecified: Secondary | ICD-10-CM | POA: Insufficient documentation

## 2019-09-03 DIAGNOSIS — Z85038 Personal history of other malignant neoplasm of large intestine: Secondary | ICD-10-CM | POA: Insufficient documentation

## 2019-09-03 DIAGNOSIS — C20 Malignant neoplasm of rectum: Secondary | ICD-10-CM | POA: Diagnosis not present

## 2019-09-03 DIAGNOSIS — Z87442 Personal history of urinary calculi: Secondary | ICD-10-CM | POA: Insufficient documentation

## 2019-09-03 DIAGNOSIS — Z79899 Other long term (current) drug therapy: Secondary | ICD-10-CM | POA: Insufficient documentation

## 2019-09-03 DIAGNOSIS — L409 Psoriasis, unspecified: Secondary | ICD-10-CM | POA: Diagnosis not present

## 2019-09-03 NOTE — Progress Notes (Signed)
  [facility] OFFICE PROGRESS NOTE   Diagnosis: Colon cancer  INTERVAL HISTORY:   [name] returns as scheduled.  She reports soreness at the lateral left breast bra line for the past 6 months.  There is no palpable change.  She can no longer palpate lymph nodes in the left axilla.  She is undergone evaluation by urology for hematuria.  She reports undergoing a cystoscopy and CT scan.  She has kidney stones.  She has noted a "lesion "at the left labia for the past month.  There has been a fullness in the left groin for the past few weeks.  No difficulty with bowel function.  No rectal bleeding.  Psoriasis has improved since starting Stelara.  Good appetite.  No difficulty with bowel function.  Objective:  Vital signs in last 24 hours:  Blood pressure 116/82, pulse (!) 101, temperature (!) 97.3 F (36.3 C), temperature source Temporal, resp. rate 18, height '5\' 4"'$  (1.626 m), weight 223 lb 8 oz (101.4 kg), SpO2 100 %.    Lymphatics: No cervical, supraclavicular, axillary, or inguinal nodes Resp: Lungs clear bilaterally Cardio: Regular rate and rhythm GI: No hepatosplenomegaly, no mass, nontender Vascular: No leg edema Breast: Examination of the lateral left breast/axilla at the site of discomfort is unremarkable. Skin: Hyperpigmentation in a suntan distribution, several mild psoriatic plaques over the trunk GYN: There is a less than 1 cm nodular lesion at the medial left labia, in the left groin there is erythema surrounding hair follicles with slight firmness at the medial inferior groin.  I cannot palpate a discrete lymph node.    Lab Results:    Lab Results  Component Value Date   CEA1 <1.00 07/20/2019     Medications: I have reviewed the patient's current medications.   Assessment/Plan: 1. Adenocarcinoma of the rectosigmoid colon ? Colonoscopy 10/11/2016-mass at 20 cm, biopsy confirmed invasive adenocarcinoma ? CTs chest, abdomen, and pelvis  10/18/2016-indeterminate right liver lesion-probable hemangioma, focal thickening at the rectosigmoid junction, 5 mm sub-solid left lower lobe nodule ? Robotic low anterior resection 11/27/2016-stage II (T3 N0) well-differentiated adenocarcinoma of the proximal rectum (above peritoneal reflection), grade 1, no lymphovascular or perineural invasion, no loss of mismatch repair protein expression, MSI-stable  2. Multiple colon polyps on the colonoscopy 10/11/2016-sessile ulcerated polyp and tubular adenoma  Colonoscopy 03/16/2018-2 mm polyp removed from the proximal transverse colon  3. Psoriasis 4.   History of kidney stones   Disposition: [name] is in clinical remission from colon cancer.  She will return for an office visit and CEA in 6 months.  She continues colonoscopy surveillance with Dr. Alessandra Bevels.  She is scheduled to see Dr. Edwyna Ready on 09/06/2019 for evaluation of the left labia, left groin, and left breast.  I suspect she has a labial cyst.  The fullness in the medial left groin may be related to folliculitis.  She will seek medical attention if this does not resolve or there is increased palpable fullness.  The left breast soreness is also likely a benign finding.  She is scheduled for a mammogram in September.  Betsy Coder, MD  09/03/2019  12:07 PM

## 2019-09-03 NOTE — Telephone Encounter (Signed)
Scheduled per 07/16 los, patient will be notified per My chart.

## 2019-09-06 DIAGNOSIS — N898 Other specified noninflammatory disorders of vagina: Secondary | ICD-10-CM | POA: Diagnosis not present

## 2019-09-06 DIAGNOSIS — N6310 Unspecified lump in the right breast, unspecified quadrant: Secondary | ICD-10-CM | POA: Diagnosis not present

## 2019-09-09 ENCOUNTER — Other Ambulatory Visit: Payer: Self-pay | Admitting: Obstetrics and Gynecology

## 2019-09-09 DIAGNOSIS — N644 Mastodynia: Secondary | ICD-10-CM

## 2019-09-09 DIAGNOSIS — N63 Unspecified lump in unspecified breast: Secondary | ICD-10-CM

## 2019-09-24 ENCOUNTER — Ambulatory Visit
Admission: RE | Admit: 2019-09-24 | Discharge: 2019-09-24 | Disposition: A | Discharge status: Home / Self Care | Payer: BC Managed Care – PPO | Source: Ambulatory Visit | Attending: Obstetrics and Gynecology | Admitting: Obstetrics and Gynecology

## 2019-09-24 ENCOUNTER — Other Ambulatory Visit: Payer: Self-pay | Admitting: Obstetrics and Gynecology

## 2019-09-24 ENCOUNTER — Other Ambulatory Visit: Payer: Self-pay

## 2019-09-24 DIAGNOSIS — N6011 Diffuse cystic mastopathy of right breast: Secondary | ICD-10-CM | POA: Diagnosis not present

## 2019-09-24 DIAGNOSIS — R928 Other abnormal and inconclusive findings on diagnostic imaging of breast: Secondary | ICD-10-CM | POA: Diagnosis not present

## 2019-09-24 DIAGNOSIS — N6310 Unspecified lump in the right breast, unspecified quadrant: Secondary | ICD-10-CM

## 2019-09-24 DIAGNOSIS — N63 Unspecified lump in unspecified breast: Secondary | ICD-10-CM

## 2019-09-24 DIAGNOSIS — N644 Mastodynia: Secondary | ICD-10-CM

## 2019-10-10 DIAGNOSIS — Z20822 Contact with and (suspected) exposure to covid-19: Secondary | ICD-10-CM | POA: Diagnosis not present

## 2019-11-17 DIAGNOSIS — Z6838 Body mass index (BMI) 38.0-38.9, adult: Secondary | ICD-10-CM | POA: Diagnosis not present

## 2019-11-17 DIAGNOSIS — Z01419 Encounter for gynecological examination (general) (routine) without abnormal findings: Secondary | ICD-10-CM | POA: Diagnosis not present

## 2019-11-17 DIAGNOSIS — Z1151 Encounter for screening for human papillomavirus (HPV): Secondary | ICD-10-CM | POA: Diagnosis not present

## 2019-11-26 DIAGNOSIS — L400 Psoriasis vulgaris: Secondary | ICD-10-CM | POA: Diagnosis not present

## 2019-11-26 DIAGNOSIS — L905 Scar conditions and fibrosis of skin: Secondary | ICD-10-CM | POA: Diagnosis not present

## 2019-11-26 DIAGNOSIS — Z85828 Personal history of other malignant neoplasm of skin: Secondary | ICD-10-CM | POA: Diagnosis not present

## 2019-11-26 DIAGNOSIS — L570 Actinic keratosis: Secondary | ICD-10-CM | POA: Diagnosis not present

## 2020-01-12 DIAGNOSIS — H40013 Open angle with borderline findings, low risk, bilateral: Secondary | ICD-10-CM | POA: Diagnosis not present

## 2020-03-03 ENCOUNTER — Other Ambulatory Visit: Payer: Self-pay

## 2020-03-03 ENCOUNTER — Telehealth: Payer: Self-pay | Admitting: Oncology

## 2020-03-03 ENCOUNTER — Inpatient Hospital Stay: Payer: BC Managed Care – PPO | Attending: Oncology

## 2020-03-03 ENCOUNTER — Inpatient Hospital Stay (HOSPITAL_BASED_OUTPATIENT_CLINIC_OR_DEPARTMENT_OTHER): Payer: BC Managed Care – PPO | Admitting: Oncology

## 2020-03-03 VITALS — BP 127/85 | HR 68 | Temp 98.1°F | Resp 18 | Ht 64.0 in | Wt 224.2 lb

## 2020-03-03 DIAGNOSIS — C19 Malignant neoplasm of rectosigmoid junction: Secondary | ICD-10-CM

## 2020-03-03 DIAGNOSIS — Z85038 Personal history of other malignant neoplasm of large intestine: Secondary | ICD-10-CM | POA: Insufficient documentation

## 2020-03-03 DIAGNOSIS — Z87442 Personal history of urinary calculi: Secondary | ICD-10-CM | POA: Diagnosis not present

## 2020-03-03 DIAGNOSIS — C20 Malignant neoplasm of rectum: Secondary | ICD-10-CM

## 2020-03-03 DIAGNOSIS — Z86010 Personal history of colonic polyps: Secondary | ICD-10-CM | POA: Insufficient documentation

## 2020-03-03 DIAGNOSIS — L409 Psoriasis, unspecified: Secondary | ICD-10-CM | POA: Insufficient documentation

## 2020-03-03 LAB — CEA (IN HOUSE-CHCC): CEA (CHCC-In House): 1.33 ng/mL (ref 0.00–5.00)

## 2020-03-03 NOTE — Progress Notes (Signed)
  [name] OFFICE PROGRESS NOTE   Diagnosis: Colon cancer  INTERVAL HISTORY:   [name] returns as scheduled.  She feels well.  She continues to have soreness in the axillary regions bilaterally.  She saw gynecology to evaluate the labial lesion.  She underwent bilateral breast ultrasounds and a mammogram on 09/24/2019 and there is no evidence of malignancy. The psoriasis is much improved since she started treatment with Stelara.  Objective:  Vital signs in last 24 hours:  Blood pressure 127/85, pulse 68, temperature 98.1 F (36.7 C), temperature source Tympanic, resp. rate 18, height $RemoveBe'5\' 4"'bCenVeUQG$  (1.626 m), weight 224 lb 3.2 oz (101.7 kg), SpO2 100 %.    Lymphatics: No cervical, supraclavicular, axillary, or inguinal nodes.  Tenderness in the bilateral axilla. Resp: Lungs clear bilaterally Cardio: Regular rate and rhythm GI: No hepatosplenomegaly, no mass, nontender  Vascular: No leg edema  Skin: Mild psoriatic plaques over the trunk and extremities   Lab Results:   Lab Results  Component Value Date   CEA1 1.33 03/03/2020     Medications: I have reviewed the patient's current medications.   Assessment/Plan: 1. Adenocarcinoma of the rectosigmoid colon ? Colonoscopy 10/11/2016-mass at 20 cm, biopsy confirmed invasive adenocarcinoma ? CTs chest, abdomen, and pelvis 10/18/2016-indeterminate right liver lesion-probable hemangioma, focal thickening at the rectosigmoid junction, 5 mm sub-solid left lower lobe nodule ? Robotic low anterior resection 11/27/2016-stage II (T3 N0) well-differentiated adenocarcinoma of the proximal rectum (above peritoneal reflection), grade 1, no lymphovascular or perineural invasion, no loss of mismatch repair protein expression, MSI-stable  2. Multiple colon polyps on the colonoscopy 10/11/2016-sessile ulcerated polyp and tubular adenoma  Colonoscopy 03/16/2018-2 mm polyp removed from the proximal transverse colon  3. Psoriasis 4.    History of kidney stones   Disposition: Kathryn Dean is in clinical remission from colon cancer.  She will return for an office visit and CEA in 6 months.  Betsy Coder, MD  03/03/2020  12:45 PM

## 2020-03-03 NOTE — Telephone Encounter (Signed)
Scheduled appointments per 11/4 los. Spoke to patient who is aware of appointment date and time. 

## 2020-03-06 ENCOUNTER — Telehealth: Payer: Self-pay

## 2020-03-06 NOTE — Telephone Encounter (Signed)
Per Dr Benay Spice:  called patient to inform, CEA is normal, slightly higher within the normal range-likely normal variation, repeat CEA in 3 months / Left message

## 2020-03-08 ENCOUNTER — Telehealth: Payer: Self-pay | Admitting: Oncology

## 2020-03-08 ENCOUNTER — Encounter: Payer: Self-pay | Admitting: Oncology

## 2020-03-08 NOTE — Telephone Encounter (Signed)
Scheduled appointment per 1/17 sch msg. Spoke to patient who is aware of appointment date and time.

## 2020-03-23 DIAGNOSIS — Z0000 Encounter for general adult medical examination without abnormal findings: Secondary | ICD-10-CM | POA: Diagnosis not present

## 2020-03-23 DIAGNOSIS — E559 Vitamin D deficiency, unspecified: Secondary | ICD-10-CM | POA: Diagnosis not present

## 2020-03-23 DIAGNOSIS — I10 Essential (primary) hypertension: Secondary | ICD-10-CM | POA: Diagnosis not present

## 2020-03-23 DIAGNOSIS — E782 Mixed hyperlipidemia: Secondary | ICD-10-CM | POA: Diagnosis not present

## 2020-03-23 DIAGNOSIS — N200 Calculus of kidney: Secondary | ICD-10-CM | POA: Diagnosis not present

## 2020-04-14 DIAGNOSIS — G4719 Other hypersomnia: Secondary | ICD-10-CM | POA: Diagnosis not present

## 2020-04-14 DIAGNOSIS — I10 Essential (primary) hypertension: Secondary | ICD-10-CM | POA: Diagnosis not present

## 2020-04-14 DIAGNOSIS — E669 Obesity, unspecified: Secondary | ICD-10-CM | POA: Diagnosis not present

## 2020-05-11 DIAGNOSIS — I10 Essential (primary) hypertension: Secondary | ICD-10-CM | POA: Diagnosis not present

## 2020-05-11 DIAGNOSIS — Z6839 Body mass index (BMI) 39.0-39.9, adult: Secondary | ICD-10-CM | POA: Diagnosis not present

## 2020-05-11 DIAGNOSIS — G4733 Obstructive sleep apnea (adult) (pediatric): Secondary | ICD-10-CM | POA: Diagnosis not present

## 2020-05-11 DIAGNOSIS — E782 Mixed hyperlipidemia: Secondary | ICD-10-CM | POA: Diagnosis not present

## 2020-05-26 DIAGNOSIS — L400 Psoriasis vulgaris: Secondary | ICD-10-CM | POA: Diagnosis not present

## 2020-05-26 DIAGNOSIS — L905 Scar conditions and fibrosis of skin: Secondary | ICD-10-CM | POA: Diagnosis not present

## 2020-05-26 DIAGNOSIS — Z85828 Personal history of other malignant neoplasm of skin: Secondary | ICD-10-CM | POA: Diagnosis not present

## 2020-05-26 DIAGNOSIS — D225 Melanocytic nevi of trunk: Secondary | ICD-10-CM | POA: Diagnosis not present

## 2020-06-05 ENCOUNTER — Other Ambulatory Visit: Payer: BC Managed Care – PPO

## 2020-06-06 ENCOUNTER — Other Ambulatory Visit: Payer: Self-pay | Admitting: *Deleted

## 2020-06-06 DIAGNOSIS — C19 Malignant neoplasm of rectosigmoid junction: Secondary | ICD-10-CM

## 2020-06-08 ENCOUNTER — Other Ambulatory Visit: Payer: Self-pay

## 2020-06-08 ENCOUNTER — Inpatient Hospital Stay: Payer: BC Managed Care – PPO | Attending: Oncology

## 2020-06-08 ENCOUNTER — Telehealth: Payer: Self-pay

## 2020-06-08 DIAGNOSIS — C19 Malignant neoplasm of rectosigmoid junction: Secondary | ICD-10-CM

## 2020-06-08 DIAGNOSIS — Z86010 Personal history of colonic polyps: Secondary | ICD-10-CM | POA: Diagnosis not present

## 2020-06-08 DIAGNOSIS — Z85038 Personal history of other malignant neoplasm of large intestine: Secondary | ICD-10-CM | POA: Diagnosis not present

## 2020-06-08 DIAGNOSIS — Z87442 Personal history of urinary calculi: Secondary | ICD-10-CM | POA: Insufficient documentation

## 2020-06-08 DIAGNOSIS — L409 Psoriasis, unspecified: Secondary | ICD-10-CM | POA: Insufficient documentation

## 2020-06-08 LAB — CEA (IN HOUSE-CHCC): CEA (CHCC-In House): 1.02 ng/mL (ref 0.00–5.00)

## 2020-06-08 NOTE — Telephone Encounter (Signed)
Pt informed of normal CEA results and to follow up as needed. Pt verbalized understanding.

## 2020-06-08 NOTE — Telephone Encounter (Signed)
-----   Message from Ladell Pier, MD sent at 06/08/2020  1:08 PM EDT ----- Please call patient, CEA is normal, follow-up as scheduled

## 2020-07-12 DIAGNOSIS — H40053 Ocular hypertension, bilateral: Secondary | ICD-10-CM | POA: Diagnosis not present

## 2020-07-14 DIAGNOSIS — Z79899 Other long term (current) drug therapy: Secondary | ICD-10-CM | POA: Diagnosis not present

## 2020-07-14 DIAGNOSIS — L409 Psoriasis, unspecified: Secondary | ICD-10-CM | POA: Diagnosis not present

## 2020-09-01 ENCOUNTER — Inpatient Hospital Stay: Payer: BLUE CROSS/BLUE SHIELD

## 2020-09-01 ENCOUNTER — Other Ambulatory Visit: Payer: BC Managed Care – PPO

## 2020-09-01 ENCOUNTER — Inpatient Hospital Stay: Payer: BLUE CROSS/BLUE SHIELD | Attending: Oncology | Admitting: Oncology

## 2020-09-01 ENCOUNTER — Telehealth: Payer: Self-pay

## 2020-09-01 NOTE — Telephone Encounter (Signed)
This nurse calls to inquire about missed appt  encourages pt to return call to reschedule appt

## 2020-09-04 ENCOUNTER — Telehealth: Payer: Self-pay | Admitting: Oncology

## 2020-09-04 NOTE — Telephone Encounter (Signed)
Called pt per Staff message from Dr. Benay Spice :  Benay Spice, Kathryn Price, MD  Kathryn Dean Missed appointment 09/01/2020, schedule follow-up visit with Lattie Haw next 1 month     Unable to reach pt. Left message for patient to call back to reschedule missed appt.

## 2020-09-07 DIAGNOSIS — G4733 Obstructive sleep apnea (adult) (pediatric): Secondary | ICD-10-CM | POA: Diagnosis not present

## 2020-09-15 DIAGNOSIS — R8271 Bacteriuria: Secondary | ICD-10-CM | POA: Diagnosis not present

## 2020-09-15 DIAGNOSIS — N200 Calculus of kidney: Secondary | ICD-10-CM | POA: Diagnosis not present

## 2020-09-15 DIAGNOSIS — R82998 Other abnormal findings in urine: Secondary | ICD-10-CM | POA: Diagnosis not present

## 2020-09-29 ENCOUNTER — Inpatient Hospital Stay (HOSPITAL_BASED_OUTPATIENT_CLINIC_OR_DEPARTMENT_OTHER): Payer: BC Managed Care – PPO | Admitting: Oncology

## 2020-09-29 ENCOUNTER — Other Ambulatory Visit: Payer: Self-pay

## 2020-09-29 ENCOUNTER — Inpatient Hospital Stay: Payer: BC Managed Care – PPO | Attending: Oncology

## 2020-09-29 VITALS — BP 115/76 | HR 68 | Temp 97.8°F | Resp 18 | Ht 64.0 in | Wt 225.8 lb

## 2020-09-29 DIAGNOSIS — Z85038 Personal history of other malignant neoplasm of large intestine: Secondary | ICD-10-CM | POA: Diagnosis not present

## 2020-09-29 DIAGNOSIS — C19 Malignant neoplasm of rectosigmoid junction: Secondary | ICD-10-CM

## 2020-09-29 DIAGNOSIS — Z86010 Personal history of colonic polyps: Secondary | ICD-10-CM | POA: Insufficient documentation

## 2020-09-29 LAB — CEA (IN HOUSE-CHCC): CEA (CHCC-In House): 1.25 ng/mL (ref 0.00–5.00)

## 2020-09-29 LAB — CEA (ACCESS): CEA (CHCC): <1 ng/mL (ref 0.00–5.00)

## 2020-09-29 NOTE — Progress Notes (Signed)
  [facility] OFFICE PROGRESS NOTE   Diagnosis: Colon cancer  INTERVAL HISTORY:   Kathryn Dean returns as scheduled.  Good appetite and energy level.  She is working.  She reports intermittent discomfort at the left upper back for the past 3 months.  She takes ibuprofen as needed.  The pain does not occur daily.  She has been kayaking frequently.  She is working.  Psoriasis is much improved with Stelara therapy.  Objective:  Vital signs in last 24 hours:  Blood pressure 115/76, pulse 68, temperature 97.8 F (36.6 C), temperature source Oral, resp. rate 18, height _0  (1.626 m), weight 225 lb 12.8 oz (102.4 kg), SpO2 98 %.    Lymphatics: No cervical, supraclavicular, axillary, or inguinal nodes Resp: Lungs clear bilaterally Cardio: Regular rate and rhythm GI: No hepatosplenomegaly, no mass, nontender Vascular: No leg edema  Skin: 1 cm erythematous lesion with a central pustule in the left axilla Musculoskeletal: Mild tenderness at the left upper back inferior and medial to the border of the scapula, no mass or rash  Lab Results:  Lab Results  Component Value Date   WBC 9.1 04/20/2019   HGB 12.8 04/20/2019   HCT 38.9 04/20/2019   MCV 90 04/20/2019   PLT 244 04/20/2019   NEUTROABS 6.1 04/20/2019    CMP  Lab Results  Component Value Date   NA 139 04/20/2019   K 4.1 04/20/2019   CL 103 04/20/2019   CO2 21 04/20/2019   GLUCOSE 108 (H) 04/20/2019   BUN 15 04/20/2019   CREATININE 0.88 04/20/2019   CALCIUM 9.8 04/20/2019   PROT 7.1 04/20/2019   ALBUMIN 4.6 04/20/2019   AST 18 04/20/2019   ALT 17 04/20/2019   ALKPHOS 121 (H) 04/20/2019   BILITOT 0.3 04/20/2019   GFRNONAA 77 04/20/2019   GFRAA 89 04/20/2019    Lab Results  Component Value Date   CEA1 1.02 06/08/2020    Medications: I have reviewed the patient's current medications.   Assessment/Plan: Adenocarcinoma of the rectosigmoid colon Colonoscopy 10/11/2016-mass at 20 cm, biopsy confirmed  invasive adenocarcinoma CTs chest, abdomen, and pelvis 10/18/2016-indeterminate right liver lesion-probable hemangioma, focal thickening at the rectosigmoid junction, 5 mm sub-solid left lower lobe nodule Robotic low anterior resection 11/27/2016-stage II (T3 N0) well-differentiated adenocarcinoma of the proximal rectum (above peritoneal reflection), grade 1, no lymphovascular or perineural invasion, no loss of mismatch repair protein expression, MSI-stable   Multiple colon polyps on the colonoscopy 10/11/2016-sessile ulcerated polyp and tubular adenoma Colonoscopy 03/16/2018-2 mm polyp removed from the proximal transverse colon   3.   Psoriasis 4.   History of kidney stones    Disposition: Kathryn Dean is in clinical remission from colon cancer.  We will follow-up on the CEA from today.  She will return for an office visit and CEA in 6 months.  She will be due for a surveillance colonoscopy in January 2023.  The discomfort at the left upper back is likely related to a benign musculoskeletal condition, potentially related to kayaking.  She plans to see Dr. Lysle Rubens if the pain persists.  Kathryn Coder, MD  09/29/2020  10:05 AM

## 2020-10-03 ENCOUNTER — Telehealth: Payer: Self-pay

## 2020-10-03 NOTE — Telephone Encounter (Signed)
TC to [facility] [phone] to get pathology report on polyps removed per Dr. Benay Spice. Message left with Pt's information requesting copy of pathology report.

## 2020-10-08 DIAGNOSIS — G4733 Obstructive sleep apnea (adult) (pediatric): Secondary | ICD-10-CM | POA: Diagnosis not present

## 2020-11-08 DIAGNOSIS — G4733 Obstructive sleep apnea (adult) (pediatric): Secondary | ICD-10-CM | POA: Diagnosis not present

## 2020-11-24 DIAGNOSIS — L400 Psoriasis vulgaris: Secondary | ICD-10-CM | POA: Diagnosis not present

## 2020-12-01 DIAGNOSIS — Z1231 Encounter for screening mammogram for malignant neoplasm of breast: Secondary | ICD-10-CM | POA: Diagnosis not present

## 2021-01-19 DIAGNOSIS — H40013 Open angle with borderline findings, low risk, bilateral: Secondary | ICD-10-CM | POA: Diagnosis not present

## 2021-03-29 DIAGNOSIS — Z85038 Personal history of other malignant neoplasm of large intestine: Secondary | ICD-10-CM | POA: Diagnosis not present

## 2021-03-29 DIAGNOSIS — I10 Essential (primary) hypertension: Secondary | ICD-10-CM | POA: Diagnosis not present

## 2021-03-29 DIAGNOSIS — E782 Mixed hyperlipidemia: Secondary | ICD-10-CM | POA: Diagnosis not present

## 2021-03-29 DIAGNOSIS — E559 Vitamin D deficiency, unspecified: Secondary | ICD-10-CM | POA: Diagnosis not present

## 2021-03-29 DIAGNOSIS — Z0000 Encounter for general adult medical examination without abnormal findings: Secondary | ICD-10-CM | POA: Diagnosis not present

## 2021-04-02 ENCOUNTER — Ambulatory Visit: Payer: BC Managed Care – PPO | Admitting: Oncology

## 2021-04-02 ENCOUNTER — Other Ambulatory Visit: Payer: BC Managed Care – PPO

## 2021-04-06 ENCOUNTER — Inpatient Hospital Stay (HOSPITAL_BASED_OUTPATIENT_CLINIC_OR_DEPARTMENT_OTHER): Payer: BC Managed Care – PPO | Admitting: Oncology

## 2021-04-06 ENCOUNTER — Other Ambulatory Visit: Payer: Self-pay

## 2021-04-06 ENCOUNTER — Inpatient Hospital Stay: Payer: BC Managed Care – PPO | Attending: Oncology

## 2021-04-06 ENCOUNTER — Encounter: Payer: Self-pay | Admitting: *Deleted

## 2021-04-06 VITALS — BP 131/87 | HR 92 | Temp 98.8°F | Resp 20 | Ht 64.0 in | Wt 227.6 lb

## 2021-04-06 DIAGNOSIS — C19 Malignant neoplasm of rectosigmoid junction: Secondary | ICD-10-CM

## 2021-04-06 DIAGNOSIS — E559 Vitamin D deficiency, unspecified: Secondary | ICD-10-CM | POA: Diagnosis not present

## 2021-04-06 DIAGNOSIS — Z86010 Personal history of colonic polyps: Secondary | ICD-10-CM | POA: Insufficient documentation

## 2021-04-06 DIAGNOSIS — Z85038 Personal history of other malignant neoplasm of large intestine: Secondary | ICD-10-CM | POA: Insufficient documentation

## 2021-04-06 DIAGNOSIS — Z87442 Personal history of urinary calculi: Secondary | ICD-10-CM | POA: Diagnosis not present

## 2021-04-06 LAB — CEA (ACCESS): CEA (CHCC): 1.16 ng/mL (ref 0.00–5.00)

## 2021-04-06 NOTE — Progress Notes (Signed)
[place] OFFICE PROGRESS NOTE   Diagnosis: Colon cancer  INTERVAL HISTORY:   Kathryn Dean returns as scheduled.  She feels well.  No difficulty with bowel function.  No bleeding.  She is due for a surveillance colonoscopy.  Psoriasis lesions have completely resolved since beginning Stelara.  Objective:  Vital signs in last 24 hours:  Blood pressure 131/87, pulse 92, temperature 98.8 F (37.1 C), temperature source Oral, resp. rate 20, height $RemoveBe'5\' 4"'LTEqGEzTL$  (1.626 m), weight 227 lb 9.6 oz (103.2 kg), SpO2 98 %.    Lymphatics: No cervical, supraclavicular, axillary, or inguinal nodes Resp: Lungs clear bilaterally Cardio: Regular rate and rhythm GI: No hepatosplenomegaly, no mass, nontender Vascular: No leg edema   Lab Results:  Lab Results  Component Value Date   WBC 9.1 04/20/2019   HGB 12.8 04/20/2019   HCT 38.9 04/20/2019   MCV 90 04/20/2019   PLT 244 04/20/2019   NEUTROABS 6.1 04/20/2019    CMP  Lab Results  Component Value Date   NA 139 04/20/2019   K 4.1 04/20/2019   CL 103 04/20/2019   CO2 21 04/20/2019   GLUCOSE 108 (H) 04/20/2019   BUN 15 04/20/2019   CREATININE 0.88 04/20/2019   CALCIUM 9.8 04/20/2019   PROT 7.1 04/20/2019   ALBUMIN 4.6 04/20/2019   AST 18 04/20/2019   ALT 17 04/20/2019   ALKPHOS 121 (H) 04/20/2019   BILITOT 0.3 04/20/2019   GFRNONAA 77 04/20/2019   GFRAA 89 04/20/2019    Lab Results  Component Value Date   CEA1 1.25 09/29/2020   CEA 1.16 04/06/2021    No results found for: INR, LABPROT  Imaging:  No results found.  Medications: I have reviewed the patient's current medications.   Assessment/Plan: Adenocarcinoma of the rectosigmoid colon Colonoscopy 10/11/2016-mass at 20 cm, biopsy confirmed invasive adenocarcinoma CTs chest, abdomen, and pelvis 10/18/2016-indeterminate right liver lesion-probable hemangioma, focal thickening at the rectosigmoid junction, 5 mm sub-solid left lower lobe nodule Robotic low  anterior resection 11/27/2016-stage II (T3 N0) well-differentiated adenocarcinoma of the proximal rectum (above peritoneal reflection), grade 1, no lymphovascular or perineural invasion, no loss of mismatch repair protein expression, MSI-stable   Multiple colon polyps on the colonoscopy 10/11/2016-sessile ulcerated polyp and tubular adenoma Colonoscopy 03/16/2018-2 mm polyp removed from the proximal transverse colon-polypoid mucosa with lymphoid aggregate, negative for dysplasia   3.   Psoriasis 4.   History of kidney stones     Disposition: Kathryn Dean is in clinical remission from colon cancer.  The CEA is normal.  She will complete a surveillance colonoscopy with Dr. Alessandra Bevels.  She will return for an office visit in 6 months.  Betsy Coder, MD  04/06/2021  10:06 AM

## 2021-04-06 NOTE — Progress Notes (Signed)
Office note and demographic sheet faxed to Dr Theador Hawthorne office Attention Cheri to facilitate scheduling of colonoscopy.

## 2021-05-04 DIAGNOSIS — Z6841 Body Mass Index (BMI) 40.0 and over, adult: Secondary | ICD-10-CM | POA: Diagnosis not present

## 2021-06-01 DIAGNOSIS — M549 Dorsalgia, unspecified: Secondary | ICD-10-CM | POA: Diagnosis not present

## 2021-06-22 DIAGNOSIS — Z6841 Body Mass Index (BMI) 40.0 and over, adult: Secondary | ICD-10-CM | POA: Diagnosis not present

## 2021-08-08 DIAGNOSIS — L821 Other seborrheic keratosis: Secondary | ICD-10-CM | POA: Diagnosis not present

## 2021-08-08 DIAGNOSIS — L814 Other melanin hyperpigmentation: Secondary | ICD-10-CM | POA: Diagnosis not present

## 2021-08-08 DIAGNOSIS — C44719 Basal cell carcinoma of skin of left lower limb, including hip: Secondary | ICD-10-CM | POA: Diagnosis not present

## 2021-08-08 DIAGNOSIS — L400 Psoriasis vulgaris: Secondary | ICD-10-CM | POA: Diagnosis not present

## 2021-08-08 DIAGNOSIS — L570 Actinic keratosis: Secondary | ICD-10-CM | POA: Diagnosis not present

## 2021-08-08 DIAGNOSIS — D485 Neoplasm of uncertain behavior of skin: Secondary | ICD-10-CM | POA: Diagnosis not present

## 2021-08-08 DIAGNOSIS — D225 Melanocytic nevi of trunk: Secondary | ICD-10-CM | POA: Diagnosis not present

## 2021-08-08 DIAGNOSIS — D2271 Melanocytic nevi of right lower limb, including hip: Secondary | ICD-10-CM | POA: Diagnosis not present

## 2021-08-24 DIAGNOSIS — L400 Psoriasis vulgaris: Secondary | ICD-10-CM | POA: Diagnosis not present

## 2021-09-06 DIAGNOSIS — C44719 Basal cell carcinoma of skin of left lower limb, including hip: Secondary | ICD-10-CM | POA: Diagnosis not present

## 2021-09-07 DIAGNOSIS — D122 Benign neoplasm of ascending colon: Secondary | ICD-10-CM | POA: Diagnosis not present

## 2021-09-07 DIAGNOSIS — D125 Benign neoplasm of sigmoid colon: Secondary | ICD-10-CM | POA: Diagnosis not present

## 2021-09-07 DIAGNOSIS — Z980 Intestinal bypass and anastomosis status: Secondary | ICD-10-CM | POA: Diagnosis not present

## 2021-09-07 DIAGNOSIS — Z85038 Personal history of other malignant neoplasm of large intestine: Secondary | ICD-10-CM | POA: Diagnosis not present

## 2021-09-20 DIAGNOSIS — Z1331 Encounter for screening for depression: Secondary | ICD-10-CM | POA: Diagnosis not present

## 2021-09-20 DIAGNOSIS — I10 Essential (primary) hypertension: Secondary | ICD-10-CM | POA: Diagnosis not present

## 2021-09-20 DIAGNOSIS — G4733 Obstructive sleep apnea (adult) (pediatric): Secondary | ICD-10-CM | POA: Diagnosis not present

## 2021-09-20 DIAGNOSIS — E782 Mixed hyperlipidemia: Secondary | ICD-10-CM | POA: Diagnosis not present

## 2021-09-20 DIAGNOSIS — E8889 Other specified metabolic disorders: Secondary | ICD-10-CM | POA: Diagnosis not present

## 2021-09-28 DIAGNOSIS — I10 Essential (primary) hypertension: Secondary | ICD-10-CM | POA: Diagnosis not present

## 2021-09-28 DIAGNOSIS — L409 Psoriasis, unspecified: Secondary | ICD-10-CM | POA: Diagnosis not present

## 2021-09-28 DIAGNOSIS — E782 Mixed hyperlipidemia: Secondary | ICD-10-CM | POA: Diagnosis not present

## 2021-10-05 ENCOUNTER — Encounter: Payer: Self-pay | Admitting: *Deleted

## 2021-10-05 ENCOUNTER — Inpatient Hospital Stay: Payer: BC Managed Care – PPO | Attending: Internal Medicine

## 2021-10-05 ENCOUNTER — Inpatient Hospital Stay: Payer: BC Managed Care – PPO | Admitting: Oncology

## 2021-10-05 NOTE — Progress Notes (Signed)
"  No show" for 6 month lab/OV today. Scheduling message sent to reschedule in 1 month.

## 2021-10-09 ENCOUNTER — Encounter: Payer: Self-pay | Admitting: *Deleted

## 2021-10-09 NOTE — Progress Notes (Signed)
Ms. Dombeck was "no show" for her 6 month follow up. Scheduling message sent to reschedule for 1 month.

## 2021-10-15 ENCOUNTER — Telehealth: Payer: Self-pay | Admitting: Oncology

## 2021-10-15 DIAGNOSIS — G4733 Obstructive sleep apnea (adult) (pediatric): Secondary | ICD-10-CM | POA: Diagnosis not present

## 2021-10-15 DIAGNOSIS — R7303 Prediabetes: Secondary | ICD-10-CM | POA: Diagnosis not present

## 2021-10-15 DIAGNOSIS — I10 Essential (primary) hypertension: Secondary | ICD-10-CM | POA: Diagnosis not present

## 2021-10-15 DIAGNOSIS — Z9189 Other specified personal risk factors, not elsewhere classified: Secondary | ICD-10-CM | POA: Diagnosis not present

## 2021-10-15 DIAGNOSIS — E782 Mixed hyperlipidemia: Secondary | ICD-10-CM | POA: Diagnosis not present

## 2021-10-15 NOTE — Telephone Encounter (Signed)
Attempted to contact patient in regards to rescheduling missed appointments no answer so voicemail left

## 2021-11-12 DIAGNOSIS — I10 Essential (primary) hypertension: Secondary | ICD-10-CM | POA: Diagnosis not present

## 2021-11-12 DIAGNOSIS — M7710 Lateral epicondylitis, unspecified elbow: Secondary | ICD-10-CM | POA: Diagnosis not present

## 2021-11-12 DIAGNOSIS — R7303 Prediabetes: Secondary | ICD-10-CM | POA: Diagnosis not present

## 2021-11-12 DIAGNOSIS — Z9189 Other specified personal risk factors, not elsewhere classified: Secondary | ICD-10-CM | POA: Diagnosis not present

## 2021-11-12 DIAGNOSIS — G4733 Obstructive sleep apnea (adult) (pediatric): Secondary | ICD-10-CM | POA: Diagnosis not present

## 2021-11-20 ENCOUNTER — Other Ambulatory Visit: Payer: Self-pay | Admitting: Internal Medicine

## 2021-11-20 DIAGNOSIS — Z1231 Encounter for screening mammogram for malignant neoplasm of breast: Secondary | ICD-10-CM

## 2021-12-04 DIAGNOSIS — N951 Menopausal and female climacteric states: Secondary | ICD-10-CM | POA: Diagnosis not present

## 2021-12-04 DIAGNOSIS — I10 Essential (primary) hypertension: Secondary | ICD-10-CM | POA: Diagnosis not present

## 2021-12-04 DIAGNOSIS — M7710 Lateral epicondylitis, unspecified elbow: Secondary | ICD-10-CM | POA: Diagnosis not present

## 2021-12-04 DIAGNOSIS — R7303 Prediabetes: Secondary | ICD-10-CM | POA: Diagnosis not present

## 2021-12-14 DIAGNOSIS — M546 Pain in thoracic spine: Secondary | ICD-10-CM | POA: Diagnosis not present

## 2021-12-14 DIAGNOSIS — G8929 Other chronic pain: Secondary | ICD-10-CM | POA: Diagnosis not present

## 2021-12-18 ENCOUNTER — Inpatient Hospital Stay: Admission: RE | Admit: 2021-12-18 | Payer: BC Managed Care – PPO | Source: Ambulatory Visit

## 2021-12-18 ENCOUNTER — Ambulatory Visit
Admission: RE | Admit: 2021-12-18 | Discharge: 2021-12-18 | Disposition: A | Discharge status: Home / Self Care | Payer: BC Managed Care – PPO | Source: Ambulatory Visit | Attending: Internal Medicine | Admitting: Internal Medicine

## 2021-12-18 DIAGNOSIS — Z1231 Encounter for screening mammogram for malignant neoplasm of breast: Secondary | ICD-10-CM

## 2021-12-28 DIAGNOSIS — M62838 Other muscle spasm: Secondary | ICD-10-CM | POA: Diagnosis not present

## 2021-12-28 DIAGNOSIS — M546 Pain in thoracic spine: Secondary | ICD-10-CM | POA: Diagnosis not present

## 2022-01-01 DIAGNOSIS — Z9189 Other specified personal risk factors, not elsewhere classified: Secondary | ICD-10-CM | POA: Diagnosis not present

## 2022-01-01 DIAGNOSIS — E559 Vitamin D deficiency, unspecified: Secondary | ICD-10-CM | POA: Diagnosis not present

## 2022-01-01 DIAGNOSIS — I10 Essential (primary) hypertension: Secondary | ICD-10-CM | POA: Diagnosis not present

## 2022-01-01 DIAGNOSIS — M7710 Lateral epicondylitis, unspecified elbow: Secondary | ICD-10-CM | POA: Diagnosis not present

## 2022-01-01 DIAGNOSIS — R7303 Prediabetes: Secondary | ICD-10-CM | POA: Diagnosis not present

## 2022-01-04 DIAGNOSIS — M546 Pain in thoracic spine: Secondary | ICD-10-CM | POA: Diagnosis not present

## 2022-01-04 DIAGNOSIS — M62838 Other muscle spasm: Secondary | ICD-10-CM | POA: Diagnosis not present

## 2022-01-07 ENCOUNTER — Telehealth: Payer: Self-pay

## 2022-01-07 NOTE — Patient Outreach (Signed)
  Care Coordination   01/07/2022 Name: Kathryn Dean MRN: [id number] DOB: 15-Mar-1970   Care Coordination Outreach Attempts:  An unsuccessful telephone outreach was attempted today to offer the patient information about available care coordination services as a benefit of their health plan.   Follow Up Plan:  Additional outreach attempts will be made to offer the patient care coordination information and services.   Encounter Outcome:  No Answer  Care Coordination Interventions Activated:  No   Care Coordination Interventions:  No, not indicated    Peter Garter RN, BSN,CCM, [facility] [phone]

## 2022-01-11 DIAGNOSIS — M546 Pain in thoracic spine: Secondary | ICD-10-CM | POA: Diagnosis not present

## 2022-01-11 DIAGNOSIS — M62838 Other muscle spasm: Secondary | ICD-10-CM | POA: Diagnosis not present

## 2022-01-15 DIAGNOSIS — E119 Type 2 diabetes mellitus without complications: Secondary | ICD-10-CM | POA: Diagnosis not present

## 2022-01-18 DIAGNOSIS — M62838 Other muscle spasm: Secondary | ICD-10-CM | POA: Diagnosis not present

## 2022-01-18 DIAGNOSIS — M546 Pain in thoracic spine: Secondary | ICD-10-CM | POA: Diagnosis not present

## 2022-01-22 DIAGNOSIS — E559 Vitamin D deficiency, unspecified: Secondary | ICD-10-CM | POA: Diagnosis not present

## 2022-01-22 DIAGNOSIS — R7303 Prediabetes: Secondary | ICD-10-CM | POA: Diagnosis not present

## 2022-01-22 DIAGNOSIS — M7710 Lateral epicondylitis, unspecified elbow: Secondary | ICD-10-CM | POA: Diagnosis not present

## 2022-01-22 DIAGNOSIS — I10 Essential (primary) hypertension: Secondary | ICD-10-CM | POA: Diagnosis not present

## 2022-01-24 ENCOUNTER — Telehealth: Payer: Self-pay

## 2022-01-24 NOTE — Patient Instructions (Signed)
Visit Information  Thank you for taking time to visit with me today. Please don't hesitate to contact me if I can be of assistance to you.   Following are the goals we discussed today:   Goals Addressed             This Visit's Progress    COMPLETED: Care Coordination Activities - no follow up required       Care Coordination Interventions: Provided education to patient re: care coordination services Assessed social determinant of health barriers         If you are experiencing a Mental Health or [place] or need someone to talk to, please call the Suicide and Crisis Lifeline: 988 call the [place] National Suicide Prevention Lifeline: 571-535-5256 or TTY: 916-174-2094 TTY (639)818-1381) to talk to a trained counselor call 1-800-273-TALK (toll free, 24 hour hotline) go to [facility] [place] [phone]) call 911   The patient verbalized understanding of instructions, educational materials, and care plan provided today and DECLINED offer to receive copy of patient instructions, educational materials, and care plan.   No further follow up required:    Peter Garter RN, Jackquline Denmark, [facility] [phone]

## 2022-01-24 NOTE — Patient Outreach (Signed)
  Care Coordination   Initial Visit Note   01/24/2022 Name: RAYLYNNE CUBBAGE MRN: [id number] DOB: [birth year]  BERYLE BAGSBY is a 51 y.o. year old female who sees Wenda Low, MD for primary care. I spoke with  Suszanne Finch by phone today.  What matters to the patients health and wellness today?  No concerns today    Goals Addressed             This Visit's Progress    COMPLETED: Care Coordination Activities - no follow up required       Care Coordination Interventions: Provided education to patient re: care coordination services Assessed social determinant of health barriers          SDOH assessments and interventions completed:  Yes  SDOH Interventions Today    Flowsheet Row Most Recent Value  SDOH Interventions   Food Insecurity Interventions Intervention Not Indicated  Housing Interventions Intervention Not Indicated  Transportation Interventions Intervention Not Indicated  Utilities Interventions Intervention Not Indicated  Physical Activity Interventions Intervention Not Indicated        Care Coordination Interventions:  Yes, provided   Follow up plan: No further intervention required.   Encounter Outcome:  Pt. Visit Completed  Peter Garter RN, BSN,CCM, CDE Care Management Coordinator [facility] [phone]

## 2022-01-25 DIAGNOSIS — M62838 Other muscle spasm: Secondary | ICD-10-CM | POA: Diagnosis not present

## 2022-01-25 DIAGNOSIS — M546 Pain in thoracic spine: Secondary | ICD-10-CM | POA: Diagnosis not present

## 2022-02-13 DIAGNOSIS — R61 Generalized hyperhidrosis: Secondary | ICD-10-CM | POA: Diagnosis not present

## 2022-02-13 DIAGNOSIS — Z01419 Encounter for gynecological examination (general) (routine) without abnormal findings: Secondary | ICD-10-CM | POA: Diagnosis not present

## 2022-02-14 DIAGNOSIS — Z08 Encounter for follow-up examination after completed treatment for malignant neoplasm: Secondary | ICD-10-CM | POA: Diagnosis not present

## 2022-02-14 DIAGNOSIS — L400 Psoriasis vulgaris: Secondary | ICD-10-CM | POA: Diagnosis not present

## 2022-02-14 DIAGNOSIS — Z85828 Personal history of other malignant neoplasm of skin: Secondary | ICD-10-CM | POA: Diagnosis not present

## 2022-03-18 DIAGNOSIS — I10 Essential (primary) hypertension: Secondary | ICD-10-CM | POA: Diagnosis not present

## 2022-03-18 DIAGNOSIS — E669 Obesity, unspecified: Secondary | ICD-10-CM | POA: Diagnosis not present

## 2022-03-18 DIAGNOSIS — Z6833 Body mass index (BMI) 33.0-33.9, adult: Secondary | ICD-10-CM | POA: Diagnosis not present

## 2022-03-18 DIAGNOSIS — R7303 Prediabetes: Secondary | ICD-10-CM | POA: Diagnosis not present

## 2022-04-05 DIAGNOSIS — Z6834 Body mass index (BMI) 34.0-34.9, adult: Secondary | ICD-10-CM | POA: Diagnosis not present

## 2022-04-05 DIAGNOSIS — I10 Essential (primary) hypertension: Secondary | ICD-10-CM | POA: Diagnosis not present

## 2022-04-05 DIAGNOSIS — Z0000 Encounter for general adult medical examination without abnormal findings: Secondary | ICD-10-CM | POA: Diagnosis not present

## 2022-04-05 DIAGNOSIS — E782 Mixed hyperlipidemia: Secondary | ICD-10-CM | POA: Diagnosis not present

## 2022-04-05 DIAGNOSIS — E559 Vitamin D deficiency, unspecified: Secondary | ICD-10-CM | POA: Diagnosis not present

## 2022-04-05 DIAGNOSIS — R7303 Prediabetes: Secondary | ICD-10-CM | POA: Diagnosis not present

## 2022-04-10 DIAGNOSIS — R7303 Prediabetes: Secondary | ICD-10-CM | POA: Diagnosis not present

## 2022-04-10 DIAGNOSIS — I10 Essential (primary) hypertension: Secondary | ICD-10-CM | POA: Diagnosis not present

## 2022-05-07 DIAGNOSIS — E669 Obesity, unspecified: Secondary | ICD-10-CM | POA: Diagnosis not present

## 2022-05-07 DIAGNOSIS — I10 Essential (primary) hypertension: Secondary | ICD-10-CM | POA: Diagnosis not present

## 2022-05-07 DIAGNOSIS — R7303 Prediabetes: Secondary | ICD-10-CM | POA: Diagnosis not present

## 2022-05-28 DIAGNOSIS — R7303 Prediabetes: Secondary | ICD-10-CM | POA: Diagnosis not present

## 2022-05-28 DIAGNOSIS — I10 Essential (primary) hypertension: Secondary | ICD-10-CM | POA: Diagnosis not present

## 2022-06-18 DIAGNOSIS — R7303 Prediabetes: Secondary | ICD-10-CM | POA: Diagnosis not present

## 2022-06-18 DIAGNOSIS — I10 Essential (primary) hypertension: Secondary | ICD-10-CM | POA: Diagnosis not present

## 2022-07-16 DIAGNOSIS — R7303 Prediabetes: Secondary | ICD-10-CM | POA: Diagnosis not present

## 2022-07-16 DIAGNOSIS — I10 Essential (primary) hypertension: Secondary | ICD-10-CM | POA: Diagnosis not present

## 2022-08-13 DIAGNOSIS — L570 Actinic keratosis: Secondary | ICD-10-CM | POA: Diagnosis not present

## 2022-08-13 DIAGNOSIS — L814 Other melanin hyperpigmentation: Secondary | ICD-10-CM | POA: Diagnosis not present

## 2022-08-13 DIAGNOSIS — L400 Psoriasis vulgaris: Secondary | ICD-10-CM | POA: Diagnosis not present

## 2022-08-13 DIAGNOSIS — L821 Other seborrheic keratosis: Secondary | ICD-10-CM | POA: Diagnosis not present

## 2022-08-13 DIAGNOSIS — D225 Melanocytic nevi of trunk: Secondary | ICD-10-CM | POA: Diagnosis not present

## 2022-08-15 DIAGNOSIS — R7303 Prediabetes: Secondary | ICD-10-CM | POA: Diagnosis not present

## 2022-08-15 DIAGNOSIS — I10 Essential (primary) hypertension: Secondary | ICD-10-CM | POA: Diagnosis not present

## 2022-08-15 DIAGNOSIS — E669 Obesity, unspecified: Secondary | ICD-10-CM | POA: Diagnosis not present

## 2022-08-26 DIAGNOSIS — L400 Psoriasis vulgaris: Secondary | ICD-10-CM | POA: Diagnosis not present

## 2022-08-28 ENCOUNTER — Other Ambulatory Visit: Payer: Self-pay | Admitting: Oncology

## 2022-08-28 DIAGNOSIS — Z006 Encounter for examination for normal comparison and control in clinical research program: Secondary | ICD-10-CM

## 2022-09-05 ENCOUNTER — Ambulatory Visit
Admission: EM | Admit: 2022-09-05 | Discharge: 2022-09-05 | Disposition: A | Discharge status: Home / Self Care | Payer: BC Managed Care – PPO | Attending: Emergency Medicine | Admitting: Emergency Medicine

## 2022-09-05 DIAGNOSIS — B349 Viral infection, unspecified: Secondary | ICD-10-CM | POA: Diagnosis not present

## 2022-09-05 DIAGNOSIS — R051 Acute cough: Secondary | ICD-10-CM | POA: Insufficient documentation

## 2022-09-05 DIAGNOSIS — Z1152 Encounter for screening for COVID-19: Secondary | ICD-10-CM | POA: Insufficient documentation

## 2022-09-05 DIAGNOSIS — R112 Nausea with vomiting, unspecified: Secondary | ICD-10-CM | POA: Diagnosis not present

## 2022-09-05 MED ORDER — ONDANSETRON 4 MG PO TBDP: 4.0000 mg | ORAL_TABLET | Freq: Three times a day (TID) | ORAL | 0 refills | Status: AC | PRN

## 2022-09-05 MED ORDER — BENZONATATE 100 MG PO CAPS: 100.0000 mg | ORAL_CAPSULE | Freq: Three times a day (TID) | ORAL | 0 refills | Status: AC | PRN

## 2022-09-05 NOTE — ED Triage Notes (Signed)
Patient presents to UC for cough, fever (101.0), HA, body aches, and chills x yesterday. Vomiting all night. Cough x 2 days ago. Treating with tylenol.

## 2022-09-05 NOTE — ED Provider Notes (Signed)
[facility]    CSN: [id number] Arrival date & time: 09/05/22  7425      History   Chief Complaint Chief Complaint  Patient presents with   Cough   Fever   Chills   Generalized Body Aches    HPI Kathryn Dean is a 52 y.o. female.  Patient presents with 2-day history of fever, chills, body aches, headache, nonproductive cough.  She vomited x 3 last night.  She took Tylenol this morning.  She denies rash, shortness of breath, diarrhea, or other symptoms.  Her medical history includes hypertension, rectosigmoid cancer, kidney stones.  The history is provided by the patient and medical records.    Past Medical History:  Diagnosis Date   Cancer [facility])    colon   Headache(784.0)    History of kidney stones    Hypertension    evaluated at pcp-not on any meds at this point    Patient Active Problem List   Diagnosis Date Noted   Rectosigmoid cancer (HCC) 11/27/2016   Hemangioma of right liver lobe 11/27/2016   Rectal cancer (HCC) 11/27/2016   Hypertension     Past Surgical History:  Procedure Laterality Date   APPENDECTOMY     DILATION AND CURETTAGE OF UTERUS     x2   EXTRACORPOREAL SHOCK WAVE LITHOTRIPSY Left 11/12/2018   Procedure: EXTRACORPOREAL SHOCK WAVE LITHOTRIPSY (ESWL);  Surgeon: Alfredo Martinez, MD;  Location: [facility];  Service: Urology;  Laterality: Left;   PROCTOSCOPY N/A 11/27/2016   Procedure: RIGID PROCTOSCOPY;  Surgeon: Karie Soda, MD;  Location: [facility];  Service: General;  Laterality: N/A;   VAGINAL DELIVERY     XI ROBOTIC ASSISTED LOWER ANTERIOR RESECTION N/A 11/27/2016   Procedure: XI ROBOTIC ASSISTED LOWER ANTERIOR RESECTION WITH RECTOSIGMOID RESECTION;  Surgeon: Karie Soda, MD;  Location: [facility];  Service: General;  Laterality: N/A;  ERAS PATHWAY    OB History   No obstetric history on file.      Home Medications    Prior to Admission medications   Medication Sig Start Date End Date Taking? Authorizing Provider  benzonatate  (TESSALON) 100 MG capsule Take 1 capsule (100 mg total) by mouth 3 (three) times daily as needed for cough. 09/05/22  Yes Mickie Bail, NP  ondansetron (ZOFRAN-ODT) 4 MG disintegrating tablet Take 1 tablet (4 mg total) by mouth every 8 (eight) hours as needed for nausea or vomiting. 09/05/22  Yes Mickie Bail, NP  Cholecalciferol (VITAMIN D3 PO) Take 1,000 mcg by mouth daily.    [provider]  lisinopril-hydrochlorothiazide (PRINZIDE,ZESTORETIC) 20-12.5 MG per tablet Take 1 tablet by mouth daily. 04/29/14   Carmelina Dane, MD  Multiple Vitamins-Minerals (MULTIVITAL PO) Take 1 tablet by mouth daily.    [provider]  OVER THE COUNTER MEDICATION Apply 1 application topically 2 (two) times daily. MG 217 Cream    [provider]  Potassium Citrate 15 MEQ (1620 MG) TBCR Take 1 tablet by mouth 2 (two) times daily. 11/15/19   [provider]  STELARA 90 MG/ML SOSY injection Inject into the skin. 07/25/20   [provider]    Family History Family History  Problem Relation Age of Onset   Cancer Mother        d. 26   Renal cancer Maternal Grandfather     Social History Social History   Tobacco Use   Smoking status: Never   Smokeless tobacco: Never  Vaping Use   Vaping status: Never  Used  Substance Use Topics   Alcohol use: Yes    Comment: social   Drug use: No     Allergies   Biaxin [clarithromycin] and Penicillins   Review of Systems Review of Systems  Constitutional:  Positive for chills and fever.  HENT:  Negative for ear discharge and sore throat.   Respiratory:  Positive for cough. Negative for shortness of breath.   Cardiovascular:  Negative for chest pain and palpitations.  Gastrointestinal:  Positive for nausea and vomiting. Negative for abdominal pain and diarrhea.  Neurological:  Positive for headaches.     Physical Exam Triage Vital Signs ED Triage Vitals  Encounter Vitals Group     BP      Systolic BP Percentile       Diastolic BP Percentile      Pulse      Resp      Temp      Temp src      SpO2      Weight      Height      Head Circumference      Peak Flow      Pain Score      Pain Loc      Pain Education      Exclude from Growth Chart    No data found.  Updated Vital Signs BP 127/78 (BP Location: Left Arm)   Pulse (!) 107   Temp 98.6 F (37 C) (Temporal)   Resp 16   SpO2 95%   Visual Acuity Right Eye Distance:   Left Eye Distance:   Bilateral Distance:    Right Eye Near:   Left Eye Near:    Bilateral Near:     Physical Exam Vitals and nursing note reviewed.  Constitutional:      General: She is not in acute distress.    Appearance: She is well-developed.  HENT:     Right Ear: Tympanic membrane normal.     Left Ear: Tympanic membrane normal.     Nose: Nose normal.     Mouth/Throat:     Mouth: Mucous membranes are moist.     Pharynx: Oropharynx is clear.  Cardiovascular:     Rate and Rhythm: Normal rate and regular rhythm.     Heart sounds: Normal heart sounds.  Pulmonary:     Effort: Pulmonary effort is normal. No respiratory distress.     Breath sounds: Normal breath sounds.     Comments: Frequent nonproductive cough. Abdominal:     General: Bowel sounds are normal.     Palpations: Abdomen is soft.     Tenderness: There is no abdominal tenderness. There is no guarding or rebound.  Musculoskeletal:     Cervical back: Neck supple.  Skin:    General: Skin is warm and dry.  Neurological:     Mental Status: She is alert.  Psychiatric:        Mood and Affect: Mood normal.        Behavior: Behavior normal.      UC Treatments / Results  Labs (all labs ordered are listed, but only abnormal results are displayed) Labs Reviewed  SARS CORONAVIRUS 2 (TAT 6-24 HRS)    EKG   Radiology No results found.  Procedures Procedures (including critical care time)  Medications Ordered in UC Medications - No data to display  Initial Impression / Assessment  and Plan / UC Course  I have reviewed the triage vital signs and the nursing notes.  Pertinent labs & imaging results that were available during my care of the patient were reviewed by me and considered in my medical decision making (see chart for details).   Viral illness, cough, vomiting.  COVID pending.  If COVID positive, patient is not interested in COVID treatment and will continue symptomatic care.  Discussed symptomatic treatment including Tylenol, rest, hydration.  Treating cough with Tessalon Perles.  Treating nausea with Zofran and clear liquid diet.  Instructed patient to follow up with her PCP if her symptoms are not improving.  She agrees to plan of care.  Final Clinical Impressions(s) / UC Diagnoses   Final diagnoses:  Viral illness  Acute cough  Nausea and vomiting, unspecified vomiting type     Discharge Instructions      Take the Tessalon Perles as directed for cough.    Your COVID test is pending.    Take the antinausea medication as directed.    Keep yourself hydrated with clear liquids, such as water.    Go to the emergency department if you have worsening symptoms.    Follow up with your primary care provider.          ED Prescriptions     Medication Sig Dispense Auth. Provider   ondansetron (ZOFRAN-ODT) 4 MG disintegrating tablet Take 1 tablet (4 mg total) by mouth every 8 (eight) hours as needed for nausea or vomiting. 20 tablet Mickie Bail, NP   benzonatate (TESSALON) 100 MG capsule Take 1 capsule (100 mg total) by mouth 3 (three) times daily as needed for cough. 21 capsule Mickie Bail, NP      I have reviewed the PDMP during this encounter.   Mickie Bail, NP 09/05/22 [phone]

## 2022-09-05 NOTE — Discharge Instructions (Addendum)
Take the [facility] as directed for cough.    Your COVID test is pending.    Take the antinausea medication as directed.    Keep yourself hydrated with clear liquids, such as water.    Go to the emergency department if you have worsening symptoms.    Follow up with your primary care provider.

## 2022-09-06 LAB — SARS CORONAVIRUS 2 (TAT 6-24 HRS): SARS Coronavirus 2: NEGATIVE

## 2022-09-19 DIAGNOSIS — I10 Essential (primary) hypertension: Secondary | ICD-10-CM | POA: Diagnosis not present

## 2022-09-19 DIAGNOSIS — R7303 Prediabetes: Secondary | ICD-10-CM | POA: Diagnosis not present

## 2022-09-19 DIAGNOSIS — Z9189 Other specified personal risk factors, not elsewhere classified: Secondary | ICD-10-CM | POA: Diagnosis not present

## 2022-10-22 DIAGNOSIS — R7303 Prediabetes: Secondary | ICD-10-CM | POA: Diagnosis not present

## 2022-10-22 DIAGNOSIS — Z9189 Other specified personal risk factors, not elsewhere classified: Secondary | ICD-10-CM | POA: Diagnosis not present

## 2022-10-22 DIAGNOSIS — E669 Obesity, unspecified: Secondary | ICD-10-CM | POA: Diagnosis not present

## 2022-10-22 DIAGNOSIS — I10 Essential (primary) hypertension: Secondary | ICD-10-CM | POA: Diagnosis not present

## 2022-11-19 DIAGNOSIS — R7303 Prediabetes: Secondary | ICD-10-CM | POA: Diagnosis not present

## 2022-11-19 DIAGNOSIS — Z9189 Other specified personal risk factors, not elsewhere classified: Secondary | ICD-10-CM | POA: Diagnosis not present

## 2022-11-19 DIAGNOSIS — I10 Essential (primary) hypertension: Secondary | ICD-10-CM | POA: Diagnosis not present

## 2022-12-16 ENCOUNTER — Other Ambulatory Visit
Admission: RE | Admit: 2022-12-16 | Discharge: 2022-12-16 | Disposition: A | Discharge status: Home / Self Care | Payer: BC Managed Care – PPO | Source: Ambulatory Visit | Attending: Oncology | Admitting: Oncology

## 2022-12-16 DIAGNOSIS — Z006 Encounter for examination for normal comparison and control in clinical research program: Secondary | ICD-10-CM | POA: Insufficient documentation

## 2022-12-18 DIAGNOSIS — Z79899 Other long term (current) drug therapy: Secondary | ICD-10-CM | POA: Diagnosis not present

## 2022-12-18 DIAGNOSIS — R7303 Prediabetes: Secondary | ICD-10-CM | POA: Diagnosis not present

## 2022-12-18 DIAGNOSIS — I10 Essential (primary) hypertension: Secondary | ICD-10-CM | POA: Diagnosis not present

## 2022-12-18 DIAGNOSIS — Z6832 Body mass index (BMI) 32.0-32.9, adult: Secondary | ICD-10-CM | POA: Diagnosis not present

## 2022-12-18 DIAGNOSIS — Z9189 Other specified personal risk factors, not elsewhere classified: Secondary | ICD-10-CM | POA: Diagnosis not present

## 2022-12-18 DIAGNOSIS — E669 Obesity, unspecified: Secondary | ICD-10-CM | POA: Diagnosis not present

## 2022-12-24 LAB — HELIX MOLECULAR SCREEN: Genetic Analysis Overall Interpretation: NEGATIVE

## 2022-12-24 LAB — GENECONNECT MOLECULAR SCREEN

## 2023-01-23 ENCOUNTER — Other Ambulatory Visit: Payer: Self-pay | Admitting: Internal Medicine

## 2023-01-23 DIAGNOSIS — Z1231 Encounter for screening mammogram for malignant neoplasm of breast: Secondary | ICD-10-CM

## 2023-01-24 DIAGNOSIS — L400 Psoriasis vulgaris: Secondary | ICD-10-CM | POA: Diagnosis not present

## 2023-02-04 DIAGNOSIS — I10 Essential (primary) hypertension: Secondary | ICD-10-CM | POA: Diagnosis not present

## 2023-02-04 DIAGNOSIS — Z9189 Other specified personal risk factors, not elsewhere classified: Secondary | ICD-10-CM | POA: Diagnosis not present

## 2023-02-04 DIAGNOSIS — R7303 Prediabetes: Secondary | ICD-10-CM | POA: Diagnosis not present

## 2023-02-21 ENCOUNTER — Ambulatory Visit
Admission: RE | Admit: 2023-02-21 | Discharge: 2023-02-21 | Disposition: A | Discharge status: Home / Self Care | Payer: BC Managed Care – PPO | Source: Ambulatory Visit | Attending: Internal Medicine | Admitting: Internal Medicine

## 2023-02-21 DIAGNOSIS — Z1231 Encounter for screening mammogram for malignant neoplasm of breast: Secondary | ICD-10-CM | POA: Diagnosis not present

## 2023-04-30 DIAGNOSIS — L400 Psoriasis vulgaris: Secondary | ICD-10-CM | POA: Diagnosis not present

## 2023-05-02 DIAGNOSIS — L400 Psoriasis vulgaris: Secondary | ICD-10-CM | POA: Diagnosis not present

## 2023-05-09 DIAGNOSIS — E782 Mixed hyperlipidemia: Secondary | ICD-10-CM | POA: Diagnosis not present

## 2023-05-09 DIAGNOSIS — E559 Vitamin D deficiency, unspecified: Secondary | ICD-10-CM | POA: Diagnosis not present

## 2023-05-09 DIAGNOSIS — R7303 Prediabetes: Secondary | ICD-10-CM | POA: Diagnosis not present

## 2023-05-09 DIAGNOSIS — Z85048 Personal history of other malignant neoplasm of rectum, rectosigmoid junction, and anus: Secondary | ICD-10-CM | POA: Diagnosis not present

## 2023-05-09 DIAGNOSIS — R232 Flushing: Secondary | ICD-10-CM | POA: Diagnosis not present

## 2023-05-09 DIAGNOSIS — Z0000 Encounter for general adult medical examination without abnormal findings: Secondary | ICD-10-CM | POA: Diagnosis not present

## 2023-05-09 DIAGNOSIS — I10 Essential (primary) hypertension: Secondary | ICD-10-CM | POA: Diagnosis not present

## 2023-08-15 DIAGNOSIS — C44519 Basal cell carcinoma of skin of other part of trunk: Secondary | ICD-10-CM | POA: Diagnosis not present

## 2023-08-15 DIAGNOSIS — D225 Melanocytic nevi of trunk: Secondary | ICD-10-CM | POA: Diagnosis not present

## 2023-08-15 DIAGNOSIS — Z79899 Other long term (current) drug therapy: Secondary | ICD-10-CM | POA: Diagnosis not present

## 2023-08-15 DIAGNOSIS — D485 Neoplasm of uncertain behavior of skin: Secondary | ICD-10-CM | POA: Diagnosis not present

## 2023-08-15 DIAGNOSIS — L814 Other melanin hyperpigmentation: Secondary | ICD-10-CM | POA: Diagnosis not present

## 2023-08-15 DIAGNOSIS — L400 Psoriasis vulgaris: Secondary | ICD-10-CM | POA: Diagnosis not present

## 2023-09-22 DIAGNOSIS — C44519 Basal cell carcinoma of skin of other part of trunk: Secondary | ICD-10-CM | POA: Diagnosis not present

## 2023-09-22 DIAGNOSIS — L905 Scar conditions and fibrosis of skin: Secondary | ICD-10-CM | POA: Diagnosis not present

## 2023-11-05 DIAGNOSIS — G479 Sleep disorder, unspecified: Secondary | ICD-10-CM | POA: Diagnosis not present

## 2023-11-05 DIAGNOSIS — Z1331 Encounter for screening for depression: Secondary | ICD-10-CM | POA: Diagnosis not present

## 2023-11-05 DIAGNOSIS — N951 Menopausal and female climacteric states: Secondary | ICD-10-CM | POA: Diagnosis not present

## 2023-11-14 DIAGNOSIS — R232 Flushing: Secondary | ICD-10-CM | POA: Diagnosis not present

## 2023-11-14 DIAGNOSIS — L409 Psoriasis, unspecified: Secondary | ICD-10-CM | POA: Diagnosis not present

## 2023-11-14 DIAGNOSIS — G4700 Insomnia, unspecified: Secondary | ICD-10-CM | POA: Diagnosis not present

## 2023-11-14 DIAGNOSIS — I10 Essential (primary) hypertension: Secondary | ICD-10-CM | POA: Diagnosis not present

## 2023-11-14 DIAGNOSIS — Z23 Encounter for immunization: Secondary | ICD-10-CM | POA: Diagnosis not present

## 2023-12-22 DIAGNOSIS — Z01419 Encounter for gynecological examination (general) (routine) without abnormal findings: Secondary | ICD-10-CM | POA: Diagnosis not present

## 2023-12-22 DIAGNOSIS — Z1331 Encounter for screening for depression: Secondary | ICD-10-CM | POA: Diagnosis not present

## 2023-12-22 DIAGNOSIS — Z124 Encounter for screening for malignant neoplasm of cervix: Secondary | ICD-10-CM | POA: Diagnosis not present

## 2024-02-06 DIAGNOSIS — N951 Menopausal and female climacteric states: Secondary | ICD-10-CM | POA: Diagnosis not present
